# Patient Record
Sex: Male | Born: 1990 | Race: White | Hispanic: No | Marital: Single | Smoking: Never smoker
Health system: Southern US, Community
[De-identification: ages and names within clinical notes are randomized; demographics above are authoritative.]

## PROBLEM LIST (undated history)

## (undated) DIAGNOSIS — R569 Unspecified convulsions: Secondary | ICD-10-CM

## (undated) DIAGNOSIS — IMO0002 Reserved for concepts with insufficient information to code with codable children: Secondary | ICD-10-CM

## (undated) DIAGNOSIS — S060X9A Concussion with loss of consciousness of unspecified duration, initial encounter: Secondary | ICD-10-CM

## (undated) DIAGNOSIS — G43909 Migraine, unspecified, not intractable, without status migrainosus: Secondary | ICD-10-CM

## (undated) HISTORY — DX: Concussion with loss of consciousness of unspecified duration, initial encounter: S06.0X9A

## (undated) HISTORY — DX: Reserved for concepts with insufficient information to code with codable children: IMO0002

## (undated) HISTORY — DX: Migraine, unspecified, not intractable, without status migrainosus: G43.909

## (undated) HISTORY — PX: NO PAST SURGERIES: SHX2092

---
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
## 2016-04-17 DIAGNOSIS — IMO0002 Reserved for concepts with insufficient information to code with codable children: Secondary | ICD-10-CM

## 2016-04-17 HISTORY — DX: Reserved for concepts with insufficient information to code with codable children: IMO0002

## 2016-04-21 ENCOUNTER — Encounter: Payer: Self-pay | Admitting: *Deleted

## 2016-04-22 ENCOUNTER — Ambulatory Visit (INDEPENDENT_AMBULATORY_CARE_PROVIDER_SITE_OTHER): Payer: BLUE CROSS/BLUE SHIELD | Admitting: Diagnostic Neuroimaging

## 2016-04-22 ENCOUNTER — Encounter: Payer: Self-pay | Admitting: Diagnostic Neuroimaging

## 2016-04-22 VITALS — BP 135/80 | HR 57 | Ht 74.0 in | Wt 260.0 lb

## 2016-04-22 DIAGNOSIS — R55 Syncope and collapse: Secondary | ICD-10-CM | POA: Diagnosis not present

## 2016-04-22 DIAGNOSIS — R4020 Unspecified coma: Secondary | ICD-10-CM | POA: Diagnosis not present

## 2016-04-22 MED ORDER — TOPIRAMATE 50 MG PO TABS: 50.0000 mg | ORAL_TABLET | Freq: Two times a day (BID) | ORAL | 12 refills | Status: DC

## 2016-04-22 NOTE — Patient Instructions (Signed)
Thank you for coming to see [place] at [facility]. I hope we have been able to provide you high quality care today.  You may receive a patient satisfaction survey over the next few weeks. We would appreciate your feedback and comments so that we may continue to improve ourselves and the health of our patients.  - seizure work up (MRI brain and EEG)  - start topiramate (to help with migraine prevention and possible seizures); start '50mg'$  twice a day x 2 weeks, then increase to '100mg'$  twice a day  - establish with primary care to work up medical/cardiac causes of syncope  - According to [facility] law, you can not drive unless you are seizure free for at least 6 months and under physician's care.   - Please maintain seizure precautions. Do not participate in activities where a loss of awareness could harm you or someone else. No swimming alone, no tub bathing, no hot tubs, no driving, no operating motorized vehicles (cars, ATVs, motocycles, etc), lawnmowers or power tools. No standing at heights, such as rooftops, ladders or stairs. Avoid hot objects such as stoves, heaters, open fires. Wear a helmet when riding a bicycle, scooter, skateboard, etc. and avoid areas of traffic. Set your water heater to 120 degrees or less.    ~~~~~~~~~~~~~~~~~~~~~~~~~~~~~~~~~~~~~~~~~~~~~~~~~~~~~~~~~~~~~~~~~  DR. Maleia Weems'S GUIDE TO HAPPY AND HEALTHY LIVING These are some of my general health and wellness recommendations. Some of them may apply to you better than others. Please use common sense as you try these suggestions and feel free to ask me any questions.   ACTIVITY/FITNESS Mental, social, emotional and physical stimulation are very important for brain and body health. Try learning a new activity (arts, music, language, sports, games).  Keep moving your body to the best of your abilities. You can do this at home, inside or outside, the park, community center, gym or anywhere you like. Consider a  physical therapist or personal trainer to get started. Consider the app Sworkit. Fitness trackers such as smart-watches, smart-phones or Fitbits can help as well.   NUTRITION Eat more plants: colorful vegetables, nuts, seeds and berries.  Eat less sugar, salt, preservatives and processed foods.  Avoid toxins such as cigarettes and alcohol.  Drink water when you are thirsty. Warm water with a slice of lemon is an excellent morning drink to start the day.  Consider these websites for more information The Nutrition Source (https://www.henry-hernandez.biz/) Precision Nutrition (WindowBlog.ch)   RELAXATION Consider practicing mindfulness meditation or other relaxation techniques such as deep breathing, prayer, yoga, tai chi, massage. See website mindful.org or the apps Headspace or Calm to help get started.   SLEEP Try to get at least 7-8+ hours sleep per day. Regular exercise and reduced caffeine will help you sleep better. Practice good sleep hygeine techniques. See website sleep.org for more information.   PLANNING Prepare estate planning, living will, healthcare POA documents. Sometimes this is best planned with the help of an attorney. Theconversationproject.org and agingwithdignity.org are excellent resources.

## 2016-04-22 NOTE — Progress Notes (Signed)
[facility]  PATIENT: Brent Austin DOB: 1990/07/09  REFERRING CLINICIAN: Henrietta Hoover HISTORY FROM: patient  REASON FOR VISIT: new consult    HISTORICAL  CHIEF COMPLAINT:  Chief Complaint  Patient presents with  . Blacked out    [facility], "hx concussion w/LOC, possible seizure 12/2010; blacked out 04/17/16""     HISTORY OF PRESENT ILLNESS:   25 year old right-handed male here for evaluation of blackout spell.  July 2012 patient had a blackout episode which was possibly provoked by being overheated and dehydrated. Apparently he had some shaking seizure-like activity. He is evaluated by neurologist who performed MRI and EEG which were unremarkable. Patient was diagnosed with possible new onset syncope versus seizure and monitored.  Patient did well until 04/17/16 when he was at home, getting out of the shower and then felt a headache. He took 2 ibuprofen and then sat down for dinner. Apparently he was moving his food around but does not recall doing this. His girlfriend went to the other room and when she came back found him passed out on the floor. He had some blood coming from his mouth. No definite convulsions. When he woke up he was crying and in a panic. Girlfriend called EMS and while waiting for them to arrive patient blacked out a second time. Patient's girlfriend performed CPR for 2 or 3 chest compressions and then patient woke back up. He then regained memory paramedics arrived. He is feeling better and opted not to go to the hospital at that time. He went to bed and the next day woke up feeling dizzy. Therefore he went to the emergency room for evaluation. CT scan and lab testing were performed the patient was discharged after 3-4 hours.  Since that time patient has felt fine. No other symptoms. No family history of seizure. Patient has history of concussion while playing football when he was younger.  Patient has history of  headaches and migraines for some years. He describes 2 kinds of headaches. First type is in every other day mild or headache which is not that bothersome. He also has a severe left-sided headache which happens every 2-3 months associated with photophobia, phonophobia and nausea.  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   REVIEW OF SYSTEMS: Full 14 system review of systems performed and negative with exception of: Blurred vision confusion headache slurred speech dizziness passing out depression not asleep racing thoughts.  ALLERGIES: No Known Allergies  HOME MEDICATIONS: No outpatient prescriptions prior to visit.   No facility-administered medications prior to visit.     PAST MEDICAL HISTORY: Past Medical History:  Diagnosis Date  . Concussion 04/2009   football, LOC  . Migraine   . Passed out 04/17/2016   blacked out 12/2010, no diagnosis found    PAST SURGICAL HISTORY: No past surgical history on file.  FAMILY HISTORY: No family history on file.  SOCIAL HISTORY:  Social History   Social History  . Marital status: Single    Spouse name: N/A  . Number of children: 1  . Years of education: 8   Occupational History  .      [employer]- [employer]   Social History Main Topics  . Smoking status: Never Smoker  . Smokeless tobacco: Never Used  . Alcohol use Yes     Comment: 04/22/16 once a week  .  Drug use: No  . Sexual activity: Not on file   Other Topics Concern  . Not on file   Social History Narrative   Lives with girl friend   Caffeine- none     PHYSICAL EXAM  GENERAL EXAM/CONSTITUTIONAL: Vitals:  Vitals:   04/22/16 0933  BP: 135/80  Pulse: (!) 57  Weight: 260 lb (117.9 kg)  Height: 6\' 2"  (1.88 m)     Body mass index is 33.38 kg/m.  Visual Acuity Screening   Right eye Left eye Both  eyes  Without correction: 20/30 20/40   With correction:        Patient is in no distress; well developed, nourished and groomed; neck is supple  CARDIOVASCULAR:  Examination of carotid arteries is normal; no carotid bruits  Regular rate and rhythm, no murmurs  Examination of peripheral vascular system by observation and palpation is normal  EYES:  Ophthalmoscopic exam of optic discs and posterior segments is normal; no papilledema or hemorrhages  MUSCULOSKELETAL:  Gait, strength, tone, movements noted in Neurologic exam below  NEUROLOGIC: MENTAL STATUS:  No flowsheet data found.  awake, alert, oriented to person, place and time  recent and remote memory intact  normal attention and concentration  language fluent, comprehension intact, naming intact,   fund of knowledge appropriate  CRANIAL NERVE:   2nd - no papilledema on fundoscopic exam  2nd, 3rd, 4th, 6th - pupils equal and reactive to light, visual fields full to confrontation, extraocular muscles intact, no nystagmus  5th - facial sensation symmetric  7th - facial strength symmetric  8th - hearing intact  9th - palate elevates symmetrically, uvula midline  11th - shoulder shrug symmetric  12th - tongue protrusion midline  MOTOR:   normal bulk and tone, full strength in the BUE, BLE  SENSORY:   normal and symmetric to light touch, temperature, vibration  COORDINATION:   finger-nose-finger, fine finger movements normal  REFLEXES:   deep tendon reflexes present and symmetric  GAIT/STATION:   narrow based gait; romberg is negative    DIAGNOSTIC DATA (LABS, IMAGING, TESTING) - I reviewed patient records, labs, notes, testing and imaging myself where available.  No results found for: WBC, HGB, HCT, MCV, PLT No results found for: NA, K, CL, CO2, GLUCOSE, BUN, CREATININE, CALCIUM, PROT, ALBUMIN, AST, ALT, ALKPHOS, BILITOT, GFRNONAA, GFRAA No results found for: CHOL, HDL, LDLCALC,  LDLDIRECT, TRIG, CHOLHDL No results found for: QQVZ5GHGBA1C No results found for: VITAMINB12 No results found for: TSH   04/18/16 CT head [I reviewed images myself and agree with interpretation. -VRP]  - negative      ASSESSMENT AND PLAN  25 y.o. year old male here with 2 episodes of syncope, with features suspicious for seizure. Will complete workup with MRI brain the EEG. Also will try empiric topiramate to help with migraine prevention as well as possible seizure disorder.   Ddx: seizure vs syncope  1. Loss of consciousness (HCC)   2. Syncope and collapse      PLAN:  - seizure work up (MRI brain w/wo and EEG)  - start topiramate (to help with migraine prevention and possible seizures); start 50mg  twice a day x 2 weeks, then increase to 100mg  twice a day; drink plenty of water (remote kidney stone age 25 years old)  - establish with primary care to work up medical/cardiac causes of syncope  - According to [employer] law, you can not drive unless you are seizure free for at least 6 months and under  physician's care.   - Please maintain seizure precautions. Do not participate in activities where a loss of awareness could harm you or someone else. No swimming alone, no tub bathing, no hot tubs, no driving, no operating motorized vehicles (cars, ATVs, motocycles, etc), lawnmowers or power tools. No standing at heights, such as rooftops, ladders or stairs. Avoid hot objects such as stoves, heaters, open fires. Wear a helmet when riding a bicycle, scooter, skateboard, etc. and avoid areas of traffic. Set your water heater to 120 degrees or less.  Orders Placed This Encounter  Procedures  . MR BRAIN W WO CONTRAST  . EEG adult   Meds ordered this encounter  Medications  . topiramate (TOPAMAX) 50 MG tablet    Sig: Take 1-2 tablets (50-100 mg total) by mouth 2 (two) times daily.    Dispense:  120 tablet    Refill:  12   Return in about 3 months (around 07/23/2016).    Suanne MarkerVIKRAM R.  Alania Overholt, MD 04/22/2016, 10:29 AM Certified in Neurology, Neurophysiology and Neuroimaging  [facility] [address], [address] [place], [place] [id number] [phone]

## 2016-04-25 ENCOUNTER — Ambulatory Visit (INDEPENDENT_AMBULATORY_CARE_PROVIDER_SITE_OTHER): Payer: BLUE CROSS/BLUE SHIELD

## 2016-04-25 DIAGNOSIS — R4020 Unspecified coma: Secondary | ICD-10-CM

## 2016-04-25 DIAGNOSIS — R55 Syncope and collapse: Secondary | ICD-10-CM

## 2016-04-25 NOTE — Procedures (Signed)
    History:  Brent ArthursJustin Mowers is a 25 year old patient with a history of a blackout episode in July 2012 promoted by being overheated and dehydrated. On 04/17/2016, the patient was getting out of the shower and felt a headache, the patient was found later passed out on the floor. No convulsions were witnessed. The patient is being evaluated for possible seizures.  This is a routine EEG. No skull defects are noted. Medications include Topamax.    EEG classification: Normal awake and asleep  Description of the recording: The background rhythms of this recording consists of a fairly well modulated medium amplitude background activity of 9 Hz. As the record progresses, the patient initially is in the waking state, but appears to enter the early stage II sleep during the recording, with rudimentary sleep spindles and vertex sharp wave activity seen. During the wakeful state, photic stimulation is performed, and this results in a bilateral and symmetric photic driving response. Hyperventilation was also performed, and this results in a minimal buildup of the background rhythm activities without significant slowing seen. At no time during the recording does there appear to be evidence of spike or spike wave discharges or evidence of focal slowing. EKG monitor shows no evidence of cardiac rhythm abnormalities with a heart rate of 72.  Impression: This is a normal EEG recording in the waking and sleeping state. No evidence of ictal or interictal discharges were seen at any time during the recording.

## 2016-05-02 ENCOUNTER — Telehealth: Payer: Self-pay | Admitting: *Deleted

## 2016-05-02 NOTE — Telephone Encounter (Signed)
Agree with plan. --VRP 

## 2016-05-02 NOTE — Telephone Encounter (Signed)
Per Dr Marjory LiesPenumalli, spoke with patient and informed him his EEG result was normal. Reviewed Dr Richrd HumblesPenumalli's plan, advice and instrucitons with patient. Patient then stated he began taking Topiramate last Sunday, took twice daily but had a "bad migraine Tues". He stated his migraines "always begin with my nose bleeding then I see a circle with my eyes that gets bigger."  He took Topiramate twice Tues, migraine reoccurred Wed, but he did not take Topiramate. He has not taken it since and stated "I have been fine." He stated that he had never had migraines back to back; the closest together he's had migraines is 6 weeks apart. This RN advised he not take Topiramate x 1 week then begin again. If migraines return, advised he stop all together and notify this RN. Confirmed his MRI for 05/11/16. Also inquired if he has established PCP. Patient stated he has not; advised he check with his insurance for providers in his network, and reminded him Dr Marjory LiesPenumalli wants PCP to check for possible medical/cardiac causes of his syncope.  This RN advised Dr Marjory LiesPenumalli will be notified of this conversation, and pt will be called back if he has further information, instructions.  Patient verbalized understanding, agreement to above conversation.

## 2016-05-09 ENCOUNTER — Emergency Department (HOSPITAL_COMMUNITY): Payer: BLUE CROSS/BLUE SHIELD

## 2016-05-09 ENCOUNTER — Other Ambulatory Visit (HOSPITAL_COMMUNITY): Payer: BLUE CROSS/BLUE SHIELD

## 2016-05-09 ENCOUNTER — Observation Stay (HOSPITAL_COMMUNITY)
Admission: EM | Admit: 2016-05-09 | Discharge: 2016-05-10 | Disposition: A | Discharge status: Home / Self Care | Payer: BLUE CROSS/BLUE SHIELD | Attending: Family Medicine | Admitting: Family Medicine

## 2016-05-09 ENCOUNTER — Encounter (HOSPITAL_COMMUNITY): Payer: Self-pay | Admitting: Emergency Medicine

## 2016-05-09 DIAGNOSIS — G40909 Epilepsy, unspecified, not intractable, without status epilepticus: Secondary | ICD-10-CM | POA: Diagnosis not present

## 2016-05-09 DIAGNOSIS — R55 Syncope and collapse: Secondary | ICD-10-CM | POA: Diagnosis present

## 2016-05-09 DIAGNOSIS — G43909 Migraine, unspecified, not intractable, without status migrainosus: Secondary | ICD-10-CM | POA: Diagnosis not present

## 2016-05-09 HISTORY — DX: Unspecified convulsions: R56.9

## 2016-05-09 LAB — RAPID URINE DRUG SCREEN, HOSP PERFORMED
AMPHETAMINES: NOT DETECTED
Barbiturates: NOT DETECTED
Benzodiazepines: NOT DETECTED
COCAINE: NOT DETECTED
OPIATES: NOT DETECTED
Tetrahydrocannabinol: NOT DETECTED

## 2016-05-09 LAB — COMPREHENSIVE METABOLIC PANEL
ALT: 55 U/L (ref 17–63)
ANION GAP: 9 (ref 5–15)
AST: 44 U/L — ABNORMAL HIGH (ref 15–41)
Albumin: 4.3 g/dL (ref 3.5–5.0)
Alkaline Phosphatase: 64 U/L (ref 38–126)
BILIRUBIN TOTAL: 0.9 mg/dL (ref 0.3–1.2)
BUN: 10 mg/dL (ref 6–20)
CO2: 24 mmol/L (ref 22–32)
Calcium: 9.5 mg/dL (ref 8.9–10.3)
Chloride: 107 mmol/L (ref 101–111)
Creatinine, Ser: 1.07 mg/dL (ref 0.61–1.24)
Glucose, Bld: 93 mg/dL (ref 65–99)
POTASSIUM: 4.2 mmol/L (ref 3.5–5.1)
Sodium: 140 mmol/L (ref 135–145)
TOTAL PROTEIN: 6.9 g/dL (ref 6.5–8.1)

## 2016-05-09 LAB — CBC WITH DIFFERENTIAL/PLATELET
Basophils Absolute: 0 10*3/uL (ref 0.0–0.1)
Basophils Relative: 0 %
EOS PCT: 2 %
Eosinophils Absolute: 0.1 10*3/uL (ref 0.0–0.7)
HEMATOCRIT: 44.3 % (ref 39.0–52.0)
Hemoglobin: 15.5 g/dL (ref 13.0–17.0)
LYMPHS PCT: 41 %
Lymphs Abs: 2.7 10*3/uL (ref 0.7–4.0)
MCH: 31 pg (ref 26.0–34.0)
MCHC: 35 g/dL (ref 30.0–36.0)
MCV: 88.6 fL (ref 78.0–100.0)
MONO ABS: 0.4 10*3/uL (ref 0.1–1.0)
MONOS PCT: 6 %
NEUTROS ABS: 3.4 10*3/uL (ref 1.7–7.7)
Neutrophils Relative %: 51 %
PLATELETS: 218 10*3/uL (ref 150–400)
RBC: 5 MIL/uL (ref 4.22–5.81)
RDW: 12.8 % (ref 11.5–15.5)
WBC: 6.7 10*3/uL (ref 4.0–10.5)

## 2016-05-09 LAB — URINALYSIS, ROUTINE W REFLEX MICROSCOPIC
Bilirubin Urine: NEGATIVE
Glucose, UA: NEGATIVE mg/dL
HGB URINE DIPSTICK: NEGATIVE
Ketones, ur: NEGATIVE mg/dL
LEUKOCYTES UA: NEGATIVE
NITRITE: NEGATIVE
PROTEIN: NEGATIVE mg/dL
Specific Gravity, Urine: 1.023 (ref 1.005–1.030)
pH: 5.5 (ref 5.0–8.0)

## 2016-05-09 LAB — I-STAT TROPONIN, ED: Troponin i, poc: 0 ng/mL (ref 0.00–0.08)

## 2016-05-09 LAB — ETHANOL: Alcohol, Ethyl (B): <5 mg/dL (ref <5)

## 2016-05-09 MED ORDER — ACETAMINOPHEN 650 MG RE SUPP: 650.0000 mg | Freq: Four times a day (QID) | RECTAL | Status: DC | PRN

## 2016-05-09 MED ORDER — GADOBENATE DIMEGLUMINE 529 MG/ML IV SOLN
20.0000 mL | Freq: Once | INTRAVENOUS | Status: AC | PRN
  Administered 2016-05-09: 20 mL via INTRAVENOUS

## 2016-05-09 MED ORDER — DIPHENHYDRAMINE HCL 50 MG/ML IJ SOLN
25.0000 mg | Freq: Once | INTRAMUSCULAR | Status: AC
  Administered 2016-05-09: 25 mg via INTRAVENOUS
  Filled 2016-05-09: qty 1

## 2016-05-09 MED ORDER — SODIUM CHLORIDE 0.9% FLUSH
3.0000 mL | Freq: Two times a day (BID) | INTRAVENOUS | Status: DC
  Administered 2016-05-09 – 2016-05-10 (×3): 3 mL via INTRAVENOUS

## 2016-05-09 MED ORDER — TOPIRAMATE 25 MG PO TABS
50.0000 mg | ORAL_TABLET | Freq: Two times a day (BID) | ORAL | Status: DC
  Administered 2016-05-09 – 2016-05-10 (×2): 50 mg via ORAL
  Filled 2016-05-09 (×2): qty 2

## 2016-05-09 MED ORDER — ACETAMINOPHEN 325 MG PO TABS: 650.0000 mg | ORAL_TABLET | Freq: Four times a day (QID) | ORAL | Status: DC | PRN

## 2016-05-09 MED ORDER — ENOXAPARIN SODIUM 30 MG/0.3ML ~~LOC~~ SOLN: 30.0000 mg | SUBCUTANEOUS | Status: DC

## 2016-05-09 MED ORDER — DEXAMETHASONE SODIUM PHOSPHATE 10 MG/ML IJ SOLN
10.0000 mg | Freq: Once | INTRAMUSCULAR | Status: AC
  Administered 2016-05-09: 10 mg via INTRAVENOUS
  Filled 2016-05-09: qty 1

## 2016-05-09 MED ORDER — METOCLOPRAMIDE HCL 5 MG/ML IJ SOLN
10.0000 mg | Freq: Once | INTRAMUSCULAR | Status: AC
  Administered 2016-05-09: 10 mg via INTRAVENOUS
  Filled 2016-05-09: qty 2

## 2016-05-09 NOTE — ED Notes (Signed)
Pt returned to room from imaging department.  

## 2016-05-09 NOTE — H&P (Signed)
[facility] History and Physical Service Pager: [phone]  Patient name: Brent CulverJustin J Austin Medical record number: [id number] Date of birth: [birth year] Age: 25 y.o. Gender: male  Primary Care Provider: PROVIDER NOT IN SYSTEM Consultants: Cardiology, Neurology Code Status: FULL  Chief Complaint: Syncopal Episode  Assessment and Plan: Brent Austin is a 25 y.o. male presenting with syncopal event. PMH is significant for migraines, syncopal event in 2012, hx of concussion.  Syncopal event- second event in 2 weeks, has seen neurology outpatient with normal EEG 04/25/16. Was scheduled for MRI on 12/6. Recently started on Topamax for migraines, feels like this made things worse. Concern due to no prodrome prior to episodes, patient cannot remember events leading up to syncope, appears to not be breathing and has had CPR after both events by fiance and coworkers. No seizure like activity or loss of bladder/bowels. No post-ictal state. Unknown amount of time without consciousness. Differential includes cardiac etiology such as HCOM, arrythmia, vs vasovagal event. Unlikely to be seizure or dehydration. CT head negative, MRI head negative. Vital signs stable in ED, no signs of infectious etiology. UDS negative, Ethanol level <5. EKG normal sinus rhythm with left axis deviation however artifact noted, troponin negative at 0.0 - Admit to telemetry for observation, attending Dr. Randolm IdolFletke - Continuous cardiac monitoring - Vitals per unit - Seizure precautions - Will consult cardiology, appreciate recommendations - Consult to neurology while patient is in hospital  - Repeat EKG -  Repeat EEG - Echocardiogram - AM CBC, BMP - Orthostatic vitals - Will obtain echocardiogram  Migraines- recently started on Topamax, felt like this brought on migraine, stopped this medication for 1 week. Started taking it again yesterday, then had syncopal event today. -Continue  topamax -Will give one time dose of benadryl, decadron, reglan for headache -Tylenol as needed for pain  FEN/GI: regular diet Prophylaxis: lovenox  Disposition: pending cardiology and neurology evaluation  History of Present Illness:  Brent Austin is a 25 y.o. male presenting with syncopal event.   Was at work this morning and had a bad headache. 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 Coworkers performed CPR on him because he was blue and appeared to not be breathing. He awoke to EMS there. Was able to answer questions appropriately. Was brought here. Feels well in ED except for continued strong headache. Typically goes to work at 6-6:30 am, occasionally eats breakfast. Did not eat breakfast this morning, reports he has not eaten anything in almost 24 hours. Denies any feelings of lightheadedness prior to fall. Reports feeling slightly nauseated this morning in addition to headache.  This is second syncopal episode in 2 weeks and his third episode overall. First episode was in 2012 at grandparent's house. Attributed at that time to being dehydrated and overheated. Concern for seizure at that time. Had been without an episode until 2 weeks ago. Had a bad headache and fiance found him on the floor. She reports he had a headache, he took ibuprofen, was walking around and she heard a thud from the other room. She was able to wake him up and he was confused, upset, saying he could not feel his body at all and was trying to make himself vomit. He then lost consciousness again and was not breathing. Fiance performed CPR on him for "seconds" and he came to, gasping for breath. EMS arrived. He did not remember walking around  after taking ibuprofen nor the interval between syncopal events.   Patient reports a history of headaches and migraines. History of concussion in high school playing football. Then went into [employer]  and was diagnosed with migraines. 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 Typically gets a migraine once every 6 months. He will have a nosebleed prior and feel a migraine coming on with photophobia and visual changes. Typically nothing gets rid of migraine except for sleeping. Has had two migraines in last two weeks which is unusual for him. These occurred after syncopal event 2 weeks ago. He saw neurology and was started on Topamax. Took this for first time before having migraines. Was able to get rid of second migraine by taking Excedrin. He stopped taking Topamax thinking it was causing migraines and at recommendation of neurology office. Restarted Topamax yesterday.  His headaches occur more frequently and typically resolve if he is distracted at work. They are worse with stress. Usually left sided, frontal. No associated tearing or nasal discharge.    Review Of Systems: Per HPI with the following additions:  Review of Systems  Constitutional: Negative for chills, diaphoresis, fever and malaise/fatigue.  HENT: Positive for nosebleeds.   Eyes: Negative for blurred vision and double vision.  Respiratory: Negative for cough and shortness of breath.   Cardiovascular: Negative for chest pain and palpitations.  Gastrointestinal: Negative for abdominal pain, constipation, diarrhea, nausea and vomiting.  Genitourinary: Negative for dysuria.  Neurological: Positive for loss of consciousness and headaches. Negative for tingling, tremors, sensory change, speech change, focal weakness and weakness.  Psychiatric/Behavioral: Negative for substance abuse.    Patient Active Problem List   Diagnosis Date Noted  . Syncope and collapse 05/09/2016    Past Medical History: Past Medical History:  Diagnosis Date  . Concussion 04/2009   football, LOC  . Migraine   . Passed out 04/17/2016   blacked out 12/2010, no diagnosis found  . Seizures (HCC)     Past Surgical History: History  reviewed. No pertinent surgical history.  Social History: Social History  Substance Use Topics  . Smoking status: Never Smoker  . Smokeless tobacco: Never Used  . Alcohol use Yes     Comment: 04/22/16 once a week   Additional social history: works at [employer] in [employer], lives with Fiance Please also refer to relevant sections of EMR.  Family History: No family history on file. Mother- migraine history Strong family history of breast cancer   No family history of seizures, sudden death  Allergies and Medications: No Known Allergies No current facility-administered medications on file prior to encounter.    Current Outpatient Prescriptions on File Prior to Encounter  Medication Sig Dispense Refill  . topiramate (TOPAMAX) 50 MG tablet Take 1-2 tablets (50-100 mg total) by mouth 2 (two) times daily. (Patient taking differently: Take 50 mg by mouth 2 (two) times daily. ) 120 tablet 12    Objective: BP 124/74   Pulse 73   Resp 19   SpO2 99%  Exam: General: well developed, well nourished, in no acute distress Eyes: EOMI, PERRLA, nystagmus on vertical gaze to left ENTM: moist mucous membranes, good dentition, no erythema or discharge noted in posterior oropharynx Neck: supple, non-tender, no lymphadenopathy Cardiovascular: bradycardic with regular rhythm, no murmurs rubs or gallops Respiratory: lungs clear to auscultation bilaterally, no increased work of breathing Gastrointestinal: +BS, soft, non-tender, non-distended MSK: moves all extremities equally Derm: skin warm, dry, no rashes noted on back or arms  Neuro: AOx3, CN2-12 in tact, 5/5 strength in upper and lower extremities bilaterally, sensation in tact Psych: normal mood and affect  Labs and Imaging: CBC BMET   Recent Labs Lab 05/09/16 0940  WBC 6.7  HGB 15.5  HCT 44.3  PLT 218    Recent Labs Lab 05/09/16 0940  NA 140  K 4.2  CL 107  CO2 24  BUN 10  CREATININE 1.07  GLUCOSE 93   CALCIUM 9.5    - Troponin 0 - Urinalysis negative - Alcohol negative - UDS negative  Ct Head Wo Contrast  Result Date: 05/09/2016 CLINICAL DATA:  Patient found down at work today. EXAM: CT HEAD WITHOUT CONTRAST TECHNIQUE: Contiguous axial images were obtained from the base of the skull through the vertex without intravenous contrast. COMPARISON:  04/08/2016 FINDINGS: Brain: There is no evidence for acute hemorrhage, hydrocephalus, mass lesion, or abnormal extra-axial fluid collection. No definite CT evidence for acute infarction. Vascular: No hyperdense vessel or unexpected calcification. Skull: No evidence for fracture. No worrisome lytic or sclerotic lesion. Sinuses/Orbits: The visualized paranasal sinuses and mastoid air cells are clear. Visualized portions of the globes and intraorbital fat are unremarkable. Other: None. IMPRESSION: Normal CT scan of the brain. Electronically Signed   By: Kennith CenterEric  Mansell M.D.   On: 05/09/2016 10:47   Mr Laqueta JeanBrain W And Wo Contrast  Result Date: 05/09/2016 CLINICAL DATA:  Syncope. Found down and unresponsive. History of seizure disorder. EXAM: MRI HEAD WITHOUT AND WITH CONTRAST TECHNIQUE: Multiplanar, multiecho pulse sequences of the brain and surrounding structures were obtained without and with intravenous contrast. CONTRAST:  20mL MULTIHANCE GADOBENATE DIMEGLUMINE 529 MG/ML IV SOLN COMPARISON:  Head CT from earlier today FINDINGS: Brain: No acute or remote infarction, hemorrhage, hydrocephalus, extra-axial collection or mass lesion. No abnormal enhancement. Symmetric normal hippocampal formations. No cortical finding to explain seizure. Vascular: Normal flow voids Skull and upper cervical spine: Negative Sinuses/Orbits: Negative IMPRESSION: Normal brain MRI. Electronically Signed   By: Marnee SpringJonathon  Watts M.D.   On: 05/09/2016 14:15   Tillman SersAngela C Riccio, DO 05/09/2016, 2:36 PM PGY-1, [facility] [facility] FPTS Intern pager: [phone], text pages welcome  Upper  Level Addendum:  I have seen and evaluated this patient along with Dr. Wonda Oldsiccio and reviewed the above note, making necessary revisions in blue.   Dr. Garry Heateraleigh Saraih Lorton, DO, PGY 05/09/2016; 6:20 PM

## 2016-05-09 NOTE — Progress Notes (Signed)
Patient arrived to 3E30 from ED via wheelchair.  Patient ambulated to bed with no assistance needed.  Pt. Is alert and oriented and VSS.  Patient oriented to room including call bell, phone, and how to reach RN/NT.  Will continue to monitor patient.

## 2016-05-09 NOTE — Consult Note (Addendum)
CARDIOLOGY CONSULT NOTE       Patient ID: Brent Austin MRN: [id number] DOB/AGE: 31-Mar-1991 25 y.o.  Admit date: 05/09/2016 Referring Physician: Randolm IdolFletke Primary Physician: PROVIDER NOT IN SYSTEM Primary Cardiologist: New/Nishan Reason for Consultation: Syncope  Active Problems:   Syncope and collapse   HPI:  25 y.o. no previous heart issues admitted with recurrent syncope Had syncope associated with headaches and migraine in 2012 Indicates Negative w/u in [place]. Seen there 2 weeks ago with syncopal episode after headache again. No antecedent chest pain palpitations or dyspnea Headache not full blown migraine. Had some nausea. "Next thing I new I was waking up"  Had some concussions growing up playing pick up football And one episode of head trauma when younger hit in head with rock. Younger brother and parents fine with no cardiac issues. No history of pacer, WPW, Congenital heart disease or long QT in family Lethargic now Denies drugs ETOH not diabetic. 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 Concussion 04/2009   football, LOC  . Migraine   . Passed out 04/17/2016   blacked out 12/2010, no diagnosis found  . Seizures (HCC)     History reviewed. No pertinent family history.  Social History   Social History  . Marital status: Single    Spouse name: N/A  . Number of children: 1  . Years of education: 6012   Occupational History  .      [employer]- [employer]   Social History Main Topics  . Smoking status: Never Smoker  . Smokeless tobacco: Never Used  . Alcohol use Yes     Comment: 04/22/16 once a week  . Drug use: No  . Sexual activity: Not on file   Other Topics Concern  . Not on file   Social History Narrative   Lives with girl friend   Caffeine- none    Past Surgical History:  Procedure Laterality Date  . NO PAST SURGERIES       . enoxaparin (LOVENOX) injection  30 mg Subcutaneous Q24H  . sodium chloride flush  3 mL Intravenous Q12H  . topiramate  50 mg Oral BID     Physical Exam: Blood pressure 132/74, pulse (!) 46, temperature 98.3 F (36.8 C), temperature source Oral, resp. rate 18, height 6\' 2"  (1.88 m), weight 120.4 kg (265 lb 8 oz), SpO2 96 %.    Affect appropriate Healthy:  appears stated age HEENT: normal Neck supple with no adenopathy JVP normal no bruits no thyromegaly Lungs clear with no wheezing and good diaphragmatic motion Heart:  S1/S2 no murmur, no rub, gallop or click PMI normal Abdomen: benighn, BS positve, no tenderness, no AAA no bruit.  No HSM or HJR Distal pulses intact with no bruits No edema Neuro non-focal Skin warm and dry tatoo right shoulder and forearm  No muscular weakness   Labs:   Lab Results  Component Value Date   WBC 6.7 05/09/2016   HGB 15.5 05/09/2016   HCT 44.3 05/09/2016   MCV 88.6 05/09/2016   PLT 218 05/09/2016    Recent Labs Lab 05/09/16 0940  NA 140  K 4.2  CL 107  CO2 24  BUN 10  CREATININE 1.07  CALCIUM 9.5  PROT 6.9  BILITOT 0.9  ALKPHOS 64  ALT 55  AST 44*  GLUCOSE 93      Radiology: Ct Head Wo Contrast  Result Date: 05/09/2016 CLINICAL DATA:  Patient found down at work today. EXAM: CT HEAD WITHOUT CONTRAST TECHNIQUE: Contiguous axial images were obtained from the base of the skull through the vertex without intravenous contrast. COMPARISON:  04/08/2016 FINDINGS: Brain: There is no evidence for acute hemorrhage, hydrocephalus, mass lesion, or abnormal extra-axial fluid collection. No definite CT evidence for acute infarction. Vascular: No hyperdense vessel or unexpected calcification. Skull: No evidence for fracture. No worrisome lytic or sclerotic lesion. Sinuses/Orbits: The visualized paranasal sinuses and mastoid air cells are  clear. Visualized portions of the globes and intraorbital fat are unremarkable. Other: None. IMPRESSION: Normal CT scan of the brain. Electronically Signed   By: Kennith CenterEric  Mansell M.D.   On: 05/09/2016 10:47   Mr Laqueta JeanBrain W And Wo Contrast  Result Date: 05/09/2016 CLINICAL DATA:  Syncope. Found down and unresponsive. History of seizure disorder. EXAM: MRI HEAD WITHOUT AND WITH CONTRAST TECHNIQUE: Multiplanar, multiecho pulse sequences of the brain and surrounding structures were obtained without and with intravenous contrast. CONTRAST:  20mL MULTIHANCE GADOBENATE DIMEGLUMINE 529 MG/ML IV SOLN COMPARISON:  Head CT from earlier today FINDINGS: Brain: No acute or remote infarction, hemorrhage, hydrocephalus, extra-axial collection or mass lesion. No abnormal enhancement. Symmetric normal hippocampal formations. No cortical finding to explain seizure. Vascular: Normal flow voids Skull and upper cervical spine: Negative Sinuses/Orbits: Negative IMPRESSION: Normal brain MRI. Electronically Signed   By: Marnee SpringJonathon  Watts M.D.   On: 05/09/2016 14:15    EKG: NSR normal ECG normal QT and normal QRS Telemetry :  NSR no arrhythmia   ASSESSMENT AND PLAN:  Syncope: no red flags regarding cardiac etiology No cardiac symptoms before or during event. Normal exam , Normal ECG and  No high risk family history. Will order echo and ETT to r/o structural heart disease, stress induced arrhythmia and show normal Hemodynamic response to exercise.  CT/MRI normal Inpatient neurology consult also ordered. EEG was normal on 04/25/16  Signed: Charlton Hawseter Nishan 05/09/2016, 5:36 PM

## 2016-05-09 NOTE — ED Notes (Signed)
Patient transported to CT 

## 2016-05-09 NOTE — ED Provider Notes (Signed)
[facility]-EMERGENCY DEPT Provider Note   CSN: [id number] Arrival date & time: 05/09/16  0935     History   Chief Complaint Chief Complaint  Patient presents with  . Loss of Consciousness    HPI Brent Austin is a 25 y.o. male.  HPI Patient presents after unwitnessed syncopal episode that occurred at work. Patient states he had no presyncopal symptoms including lightheadedness, visual changes or nausea. Found by coworkers and noted to be cyanotic. Chest compressions initiated. Patient states he remembers when EMS arrived. He denies any chest pain or shortness of breath. States he woke up with a left frontal headache that is been persistent today. Worsened roughly 2 hours ago. No radiation of the pain. He has had some ear ringing and the sensation of room spinning. This is worse with any movement. Patient has similar episode roughly 2 weeks ago. Went to [facility] emergency department. Had a CT head which was normal. This followed up with neurology. States he's had an EEG which was normal. Past Medical History:  Diagnosis Date  . Concussion 04/2009   football, LOC  . Migraine   . Passed out 04/17/2016   blacked out 12/2010, no diagnosis found  . Seizures [facility])     Patient Active Problem List   Diagnosis Date Noted  . Syncope and collapse 05/09/2016    Past Surgical History:  Procedure Laterality Date  . NO PAST SURGERIES         Home Medications    Prior to Admission medications   Medication Sig Start Date End Date Taking? Authorizing Provider  topiramate (TOPAMAX) 50 MG tablet Take 1-2 tablets (50-100 mg total) by mouth 2 (two) times daily. Patient taking differently: Take 50 mg by mouth 2 (two) times daily.  04/22/16  Yes Suanne Marker, MD    Family History History reviewed. No pertinent family history.  Social History Social History  Substance Use Topics  . Smoking status: Never Smoker  . Smokeless tobacco: Never Used  . Alcohol use Yes     Comment: 04/22/16  once a week     Allergies   Patient has no known allergies.   Review of Systems Review of Systems  Constitutional: Negative for chills and fever.  HENT: Positive for congestion. Negative for ear discharge, ear pain, facial swelling, sinus pain, sinus pressure, sore throat, trouble swallowing and voice change.   Eyes: Positive for visual disturbance. Negative for photophobia.  Respiratory: Negative for cough and shortness of breath.   Cardiovascular: Negative for chest pain, palpitations and leg swelling.  Gastrointestinal: Positive for nausea. Negative for abdominal pain, diarrhea and vomiting.  Genitourinary: Negative for dysuria, flank pain and frequency.  Musculoskeletal: Negative for back pain, myalgias, neck pain and neck stiffness.  Skin: Negative for rash and wound.  Neurological: Positive for dizziness, syncope and headaches. Negative for speech difficulty, weakness, light-headedness and numbness.  Psychiatric/Behavioral: Negative for confusion.  All other systems reviewed and are negative.    Physical Exam Updated Vital Signs BP 132/84 (BP Location: Left Arm)   Pulse 90   Temp 98.6 F (37 C) (Oral)   Resp 16   Ht 6\' 2"  (1.88 m)   Wt 264 lb (119.7 kg) Comment: Scale B  SpO2 97%   BMI 33.90 kg/m   Physical Exam  Constitutional: He is oriented to person, place, and time. He appears well-developed and well-nourished.  HENT:  Head: Normocephalic and atraumatic.  Mouth/Throat: Oropharynx is clear and moist. No oropharyngeal exudate.  No intraoral  trauma.  Eyes: EOM are normal. Pupils are equal, round, and reactive to light.  Horizontal nystagmus  Neck: Normal range of motion. Neck supple.  No meningismus. No posterior midline cervical tenderness to palpation.  Cardiovascular: Normal rate and regular rhythm.  Exam reveals no gallop and no friction rub.   No murmur heard. Pulmonary/Chest: Effort normal and breath sounds normal.  Abdominal: Soft. Bowel sounds are  normal. There is no tenderness. There is no rebound and no guarding.  Musculoskeletal: Normal range of motion. He exhibits no edema or tenderness.  No lower extremity swelling, asymmetry or tenderness.  Neurological: He is alert and oriented to person, place, and time.  Patient is alert and oriented x3 with clear, goal oriented speech. Patient has 5/5 motor in all extremities. Sensation is intact to light touch. Bilateral finger-to-nose is normal with no signs of dysmetria.   Skin: Skin is warm and dry. Capillary refill takes less than 2 seconds. No rash noted. No erythema.  Psychiatric: He has a normal mood and affect. His behavior is normal.  Nursing note and vitals reviewed.    ED Treatments / Results  Labs (all labs ordered are listed, but only abnormal results are displayed) Labs Reviewed  COMPREHENSIVE METABOLIC PANEL - Abnormal; Notable for the following:       Result Value   AST 44 (*)    All other components within normal limits  BASIC METABOLIC PANEL - Abnormal; Notable for the following:    Glucose, Bld 153 (*)    All other components within normal limits  CBC - Abnormal; Notable for the following:    WBC 12.9 (*)    All other components within normal limits  GLUCOSE, CAPILLARY - Abnormal; Notable for the following:    Glucose-Capillary 119 (*)    All other components within normal limits  LIPID PANEL - Abnormal; Notable for the following:    Triglycerides 163 (*)    HDL 38 (*)    LDL Cholesterol 111 (*)    All other components within normal limits  CBC WITH DIFFERENTIAL/PLATELET  URINALYSIS, ROUTINE W REFLEX MICROSCOPIC (NOT AT [facility])  RAPID URINE DRUG SCREEN, HOSP PERFORMED  ETHANOL  TSH  HEMOGLOBIN A1C  I-STAT TROPOININ, ED    EKG  EKG Interpretation  Date/Time:  Monday May 09 2016 09:39:55 EST Ventricular Rate:  63 PR Interval:    QRS Duration: 101 QT Interval:  402 QTC Calculation: 412 R Axis:   -35 Text Interpretation:  Sinus rhythm Short PR  interval Left axis deviation Confirmed by Ranae PalmsYELVERTON  MD, Salvatore Shear ([id number]) on 05/09/2016 10:25:20 AM       Radiology Ct Head Wo Contrast  Result Date: 05/09/2016 CLINICAL DATA:  Patient found down at work today. EXAM: CT HEAD WITHOUT CONTRAST TECHNIQUE: Contiguous axial images were obtained from the base of the skull through the vertex without intravenous contrast. COMPARISON:  04/08/2016 FINDINGS: Brain: There is no evidence for acute hemorrhage, hydrocephalus, mass lesion, or abnormal extra-axial fluid collection. No definite CT evidence for acute infarction. Vascular: No hyperdense vessel or unexpected calcification. Skull: No evidence for fracture. No worrisome lytic or sclerotic lesion. Sinuses/Orbits: The visualized paranasal sinuses and mastoid air cells are clear. Visualized portions of the globes and intraorbital fat are unremarkable. Other: None. IMPRESSION: Normal CT scan of the brain. Electronically Signed   By: Kennith CenterEric  Mansell M.D.   On: 05/09/2016 10:47   Mr Laqueta JeanBrain W And Wo Contrast  Result Date: 05/09/2016 CLINICAL DATA:  Syncope. Found down and  unresponsive. History of seizure disorder. EXAM: MRI HEAD WITHOUT AND WITH CONTRAST TECHNIQUE: Multiplanar, multiecho pulse sequences of the brain and surrounding structures were obtained without and with intravenous contrast. CONTRAST:  20mL MULTIHANCE GADOBENATE DIMEGLUMINE 529 MG/ML IV SOLN COMPARISON:  Head CT from earlier today FINDINGS: Brain: No acute or remote infarction, hemorrhage, hydrocephalus, extra-axial collection or mass lesion. No abnormal enhancement. Symmetric normal hippocampal formations. No cortical finding to explain seizure. Vascular: Normal flow voids Skull and upper cervical spine: Negative Sinuses/Orbits: Negative IMPRESSION: Normal brain MRI. Electronically Signed   By: Marnee SpringJonathon  Watts M.D.   On: 05/09/2016 14:15    Procedures Procedures (including critical care time)  Medications Ordered in ED Medications  topiramate  (TOPAMAX) tablet 50 mg (50 mg Oral Given 05/10/16 1141)  sodium chloride flush (NS) 0.9 % injection 3 mL (3 mLs Intravenous Given 05/10/16 1141)  acetaminophen (TYLENOL) tablet 650 mg (not administered)    Or  acetaminophen (TYLENOL) suppository 650 mg (not administered)  enoxaparin (LOVENOX) injection 60 mg (not administered)  gadobenate dimeglumine (MULTIHANCE) injection 20 mL (20 mLs Intravenous Contrast Given 05/09/16 1355)  diphenhydrAMINE (BENADRYL) injection 25 mg (25 mg Intravenous Given 05/09/16 1640)  dexamethasone (DECADRON) injection 10 mg (10 mg Intravenous Given 05/09/16 1640)  metoCLOPramide (REGLAN) injection 10 mg (10 mg Intravenous Given 05/09/16 1640)     Initial Impression / Assessment and Plan / ED Course  I have reviewed the triage vital signs and the nursing notes.  Pertinent labs & imaging results that were available during my care of the patient were reviewed by me and considered in my medical decision making (see chart for details).  Clinical Course   Patient remains hemodynamically stable in the emergency department. Discussed with family medicine teaching service and will admit for telemetry observation.   Final Clinical Impressions(s) / ED Diagnoses   Final diagnoses:  Syncope and collapse    New Prescriptions Discharge Medication List as of 05/10/2016  2:53 PM       Loren Raceravid Delmas Faucett, MD 05/10/16 1546

## 2016-05-09 NOTE — ED Notes (Signed)
Pt transported to MRI 

## 2016-05-09 NOTE — ED Triage Notes (Signed)
Per [facility] called out for syncope after coworkers found the patient lying face down unresponsive.  EMS reports coworkers state the patient's face and hands were blue.  Coworkers initiated chest compressions and patient became responsive shortly after.  Patient states he was diagnosed with seizures in 2012 but hasn't had any seizures since then until 2 weeks ago when his fiance found him unresponsive on the floor.  Patient has no recollection of any unusual feeling prior to incidents.  Patient is alert and oriented at this time.  20g IV in left AC.

## 2016-05-10 ENCOUNTER — Observation Stay (HOSPITAL_BASED_OUTPATIENT_CLINIC_OR_DEPARTMENT_OTHER): Payer: BLUE CROSS/BLUE SHIELD

## 2016-05-10 ENCOUNTER — Telehealth: Payer: Self-pay | Admitting: Diagnostic Neuroimaging

## 2016-05-10 ENCOUNTER — Other Ambulatory Visit (HOSPITAL_COMMUNITY): Payer: Self-pay | Admitting: *Deleted

## 2016-05-10 ENCOUNTER — Observation Stay (HOSPITAL_COMMUNITY): Payer: BLUE CROSS/BLUE SHIELD

## 2016-05-10 DIAGNOSIS — G43909 Migraine, unspecified, not intractable, without status migrainosus: Secondary | ICD-10-CM | POA: Diagnosis not present

## 2016-05-10 DIAGNOSIS — R55 Syncope and collapse: Secondary | ICD-10-CM

## 2016-05-10 DIAGNOSIS — G40909 Epilepsy, unspecified, not intractable, without status epilepticus: Secondary | ICD-10-CM | POA: Diagnosis not present

## 2016-05-10 LAB — EXERCISE TOLERANCE TEST
CSEPED: 10 min
CSEPEDS: 2 s
CSEPEW: 10.4 METS
CSEPHR: 92 %
MPHR: 195 {beats}/min
Peak HR: 181 {beats}/min
Rest HR: 94 {beats}/min

## 2016-05-10 LAB — CBC
HEMATOCRIT: 45.5 % (ref 39.0–52.0)
HEMOGLOBIN: 16.1 g/dL (ref 13.0–17.0)
MCH: 30.9 pg (ref 26.0–34.0)
MCHC: 35.4 g/dL (ref 30.0–36.0)
MCV: 87.3 fL (ref 78.0–100.0)
Platelets: 264 10*3/uL (ref 150–400)
RBC: 5.21 MIL/uL (ref 4.22–5.81)
RDW: 12.2 % (ref 11.5–15.5)
WBC: 12.9 10*3/uL — AB (ref 4.0–10.5)

## 2016-05-10 LAB — LIPID PANEL
CHOL/HDL RATIO: 4.8 ratio
Cholesterol: 182 mg/dL (ref 0–200)
HDL: 38 mg/dL — AB (ref >40)
LDL CALC: 111 mg/dL — AB (ref 0–99)
Triglycerides: 163 mg/dL — ABNORMAL HIGH (ref <150)
VLDL: 33 mg/dL (ref 0–40)

## 2016-05-10 LAB — BASIC METABOLIC PANEL
Anion gap: 10 (ref 5–15)
BUN: 14 mg/dL (ref 6–20)
CHLORIDE: 102 mmol/L (ref 101–111)
CO2: 23 mmol/L (ref 22–32)
Calcium: 9.9 mg/dL (ref 8.9–10.3)
Creatinine, Ser: 1.16 mg/dL (ref 0.61–1.24)
GFR calc non Af Amer: >60 mL/min (ref >60)
Glucose, Bld: 153 mg/dL — ABNORMAL HIGH (ref 65–99)
POTASSIUM: 4.2 mmol/L (ref 3.5–5.1)
SODIUM: 135 mmol/L (ref 135–145)

## 2016-05-10 LAB — ECHOCARDIOGRAM COMPLETE
Height: 74 in
Weight: 4224 oz

## 2016-05-10 LAB — TSH: TSH: 0.416 u[IU]/mL (ref 0.350–4.500)

## 2016-05-10 LAB — GLUCOSE, CAPILLARY: GLUCOSE-CAPILLARY: 119 mg/dL — AB (ref 65–99)

## 2016-05-10 MED ORDER — ENOXAPARIN SODIUM 60 MG/0.6ML ~~LOC~~ SOLN: 60.0000 mg | SUBCUTANEOUS | Status: DC

## 2016-05-10 NOTE — Telephone Encounter (Signed)
I spoke with Brent Austin to inform him since he had the MRI Brain w/wo contrast at the hospital I canceled the one that we had scheduled for tomorrow on the [facility] mobile unit. He also asked him if he could see Dr. Marjory LiesPenumalli sooner in January. The best number to contact the patient is [phone]

## 2016-05-10 NOTE — Progress Notes (Signed)
EEG completed, results pending. 

## 2016-05-10 NOTE — Discharge Instructions (Signed)
Syncope °Introduction °Syncope is when you lose temporarily pass out (faint). Signs that you may be about to pass out include: °· Feeling dizzy or light-headed. °· Feeling sick to your stomach (nauseous). °· Seeing all white or all black. °· Having cold, clammy skin. °If you passed out, get help right away. Call your local emergency services (911 in the [place] Do not drive yourself to the hospital. °Follow these instructions at home: °Pay attention to any changes in your symptoms. Take these actions to help with your condition: °· Have someone stay with you until you feel stable. °· Do not drive, use machinery, or play sports until your doctor says it is okay. °· Keep all follow-up visits as told by your doctor. This is important. °· If you start to feel like you might pass out, lie down right away and raise (elevate) your feet above the level of your heart. Breathe deeply and steadily. Wait until all of the symptoms are gone. °· Drink enough fluid to keep your pee (urine) clear or pale yellow. °· If you are taking blood pressure or heart medicine, get up slowly and spend many minutes getting ready to sit and then stand. This can help with dizziness. °· Take over-the-counter and prescription medicines only as told by your doctor. °Get help right away if: °· You have a very bad headache. °· You have unusual pain in your chest, tummy, or back. °· You are bleeding from your mouth or rectum. °· You have black or tarry poop (stool). °· You have a very fast or uneven heartbeat (palpitations). °· It hurts to breathe. °· You pass out once or more than once. °· You have jerky movements that you cannot control (seizure). °· You are confused. °· You have trouble walking. °· You are very weak. °· You have vision problems. °These symptoms may be an emergency. Do not wait to see if the symptoms will go away. Get medical help right away. Call your local emergency services (911 in the [place] Do not drive yourself to the hospital.    °This information is not intended to replace advice given to you by your health care provider. Make sure you discuss any questions you have with your health care provider. °Document Released: 11/09/2007 Document Revised: 10/29/2015 Document Reviewed: 02/04/2015 °© 2017 Elsevier ° °

## 2016-05-10 NOTE — Discharge Summary (Signed)
[facility]  Patient name: Brent CulverJustin J Sizemore Medical record number: [id number] Date of birth: Apr 17, 1991 Age: 25 y.o. Gender: male Date of Admission: 05/09/2016  Date of Discharge: 05/10/16  Admitting Physician: Uvaldo RisingKyle J Fletke, MD  Primary Care Provider: PROVIDER NOT IN SYSTEM Consultants: neurology, cardiology  Indication for Hospitalization: syncope  Discharge Diagnoses/Problem List:  Syncope  Disposition: home  Discharge Condition: stable, improved  Discharge Exam: see progress note from 05/10/16  Brief Hospital Course:  Toni ArthursJustin Szczesny is a 25 year old with a PMH of migraines who [facility] after a syncopal event at work. This was his second syncopal event in 2 weeks. He had seen neurology outpatient and had a normal EEG. [facility]. Work up including a repeat EEG that was normal, normal echocardiogram, negative orthostatic vital signs. 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 Troponin was negative. Cardiology saw the patient and performed and exercise treadmill test (ETT) that was normal. Cardiology felt he was safe for discharge home from a cardiac standpoint. He was discharged home with [facility].  Issues for Follow Up:  1. Follow up A1C, lipid panel results 2. [facility] 3. Potentially needs further ischemic work up for left anterior fascicular block   Significant Procedures: none  Significant Labs and Imaging:   Recent Labs Lab 05/09/16 0940 05/10/16 0403  WBC 6.7 12.9*  HGB 15.5 16.1  HCT 44.3 45.5  PLT 218 264    Recent Labs Lab 05/09/16 0940 05/10/16 0403  NA 140 135  K 4.2 4.2  CL 107 102  CO2 24 23  GLUCOSE 93 153*  BUN 10 14  CREATININE 1.07 1.16  CALCIUM 9.5 9.9  ALKPHOS 64  --   AST 44*  --   ALT 55  --   ALBUMIN 4.3  --    Ct Head Wo Contrast  Result  Date: 05/09/2016 CLINICAL DATA:  Patient found down at work today. EXAM: CT HEAD WITHOUT CONTRAST TECHNIQUE: Contiguous axial images were obtained from the base of the skull through the vertex without intravenous contrast. COMPARISON:  04/08/2016 FINDINGS: Brain: There is no evidence for acute hemorrhage, hydrocephalus, mass lesion, or abnormal extra-axial fluid collection. No definite CT evidence for acute infarction. Vascular: No hyperdense vessel or unexpected calcification. Skull: No evidence for fracture. No worrisome lytic or sclerotic lesion. Sinuses/Orbits: The visualized paranasal sinuses and mastoid air cells are clear. Visualized portions of the globes and intraorbital fat are unremarkable. Other: None. IMPRESSION: Normal CT scan of the brain. Electronically Signed   By: Kennith CenterEric  Mansell M.D.   On: 05/09/2016 10:47   Mr Laqueta JeanBrain W And Wo Contrast  Result Date: 05/09/2016 CLINICAL DATA:  Syncope. Found down and unresponsive. History of seizure disorder. EXAM: MRI HEAD WITHOUT AND WITH CONTRAST TECHNIQUE: Multiplanar, multiecho pulse sequences of the brain and surrounding structures were obtained without and with intravenous contrast. CONTRAST:  20mL MULTIHANCE GADOBENATE DIMEGLUMINE 529 MG/ML IV SOLN COMPARISON:  Head CT from earlier today FINDINGS: Brain: No acute or remote infarction, hemorrhage, hydrocephalus, extra-axial collection or mass lesion. No abnormal enhancement. Symmetric normal hippocampal formations. No cortical finding to explain seizure. Vascular: Normal flow voids Skull and upper cervical spine: Negative Sinuses/Orbits: Negative IMPRESSION: Normal brain MRI. Electronically Signed   By: Marnee SpringJonathon  Watts M.D.   On: 05/09/2016 14:15   Echocardiogram: - Left ventricle:  The cavity size was normal. Systolic function was   normal. The estimated ejection fraction was in the range of 55%   to 60%. Wall motion was normal; there were no regional wall   motion abnormalities.   Exercise  treadmill test:There was no ST segment deviation noted during stress.  No T wave inversion was noted during stress.  Patient had an excellent exercise capacity, achieving over 10 METS. No chest discomfort, significant dyspnea, palpitations, or lightheadedness noted. EKG with no acute ST or T-wave changes. Occasional PVC's but no significant arrhythmias. Overall, a low-risk ETT.  EEG: This awake and drowsy EEG is normal.    Results/Tests Pending at Time of Discharge: A1C, Lipid panel  Discharge Medications:    Medication List    TAKE these medications   topiramate 50 MG tablet Commonly known as:  TOPAMAX Take 1-2 tablets (50-100 mg total) by mouth 2 (two) times daily. What changed:  how much to take       Discharge Instructions: Please refer to Patient Instructions section of EMR for full details.  Patient was counseled important signs and symptoms that should prompt return to medical care, changes in medications, dietary instructions, activity restrictions, and follow up appointments.   Follow-Up Appointments: Follow-up Information    Renne Muscaaniel L Warden, MD. Go on 05/17/2016.   Why:  at 2 pm Contact information: [address] [place] [address] [phone]        Joycelyn SchmidPENUMALLI,VIKRAM, MD. Schedule an appointment as soon as possible for a visit.   Specialties:  Neurology, Radiology Why:  after discharge for follow up Contact information: [address] [place] [address] [phone]           Tillman Sersngela C Parthenia Tellefsen, DO 05/10/2016, 12:14 PM PGY-1, [facility]

## 2016-05-10 NOTE — Telephone Encounter (Signed)
[facility] called to schedule f/u appt for pt. Pt has appt f/u on 07/25/16. He was seen in ED yesterday for syncope and collapse, he is being discharged today . Pt was last seen 11/17, EEG 11/29. Does this pt need to be seen sooner? Please call

## 2016-05-10 NOTE — Procedures (Signed)
ELECTROENCEPHALOGRAM REPORT  Date of Study: 05/10/2016  Patient's Name: Brent Austin MRN: [id number] Date of Birth: 28-Jan-1991  Referring Provider: Dolores PattyAngela Riccio, DO  Clinical History: 25 year old male with history of migraine and multiple concussions with recurrent episodes of syncope and unresponsiveness.  Medications: Hospital Medications L1 acetaminophen (TYLENOL) suppository 650 mg  L1 acetaminophen (TYLENOL) tablet 650 mg   enoxaparin (LOVENOX) injection 30 mg   sodium chloride flush (NS) 0.9 % injection 3 mL   topiramate (TOPAMAX) tablet 50 mg    Technical Summary: A multichannel digital EEG recording measured by the international 10-20 system with electrodes applied with paste and impedances below 5000 ohms performed in our laboratory with EKG monitoring in an awake and drowsy patient.  Hyperventilation and photic stimulation were  performed.  The digital EEG was referentially recorded, reformatted, and digitally filtered in a variety of bipolar and referential montages for optimal display.    Description: The patient is awake and drowsy during the recording.  During maximal wakefulness, there is a symmetric, medium voltage 10 Hz posterior dominant rhythm that attenuates with eye opening.  The record is symmetric.  During drowsiness and sleep, there is an increase in theta slowing of the background.  Stage 2 sleep was not seen.  Hyperventilation and photic stimulation did not elicit any abnormalities.  There were no epileptiform discharges or electrographic seizures seen.    EKG lead was unremarkable.  Impression: This awake and drowsy EEG is normal.    Clinical Correlation: A normal EEG does not exclude a clinical diagnosis of epilepsy.  If further clinical questions remain, prolonged EEG may be helpful.  Clinical correlation is advised.   Shon MilletAdam Jaffe, DO

## 2016-05-10 NOTE — Progress Notes (Signed)
Patient ID: Brent CulverJustin J Gaw, male   DOB: 10-10-1990, 25 y.o.   MRN: [id number]   Patient Name: Brent Austin Date of Encounter: 05/10/2016  [facility]   Hospital Problem List     Active Problems:   Syncope and collapse     Subjective   No cardiac complaints  Inpatient Medications    Scheduled Meds: . enoxaparin (LOVENOX) injection  30 mg Subcutaneous Q24H  . sodium chloride flush  3 mL Intravenous Q12H  . topiramate  50 mg Oral BID   Continuous Infusions:  PRN Meds: acetaminophen **OR** acetaminophen   Vital Signs    Vitals:   05/09/16 1554 05/09/16 1949 05/10/16 0010 05/10/16 0637  BP: 132/74 130/75 130/76 (!) 109/53  Pulse: (!) 46 87 85 83  Resp: 18 18 18 18   Temp: 98.3 F (36.8 C) 98.4 F (36.9 C) 98.2 F (36.8 C) 98 F (36.7 C)  TempSrc: Oral Oral Oral Oral  SpO2: 96% 96% 97% 93%  Weight:    119.7 kg (264 lb)  Height:        Intake/Output Summary (Last 24 hours) at 05/10/16 0802 Last data filed at 05/10/16 0600  Gross per 24 hour  Intake              363 ml  Output                0 ml  Net              363 ml   Filed Weights   05/09/16 1542 05/10/16 0637  Weight: 120.4 kg (265 lb 8 oz) 119.7 kg (264 lb)    Physical Exam    GEN: Well nourished, well developed, in no acute distress.  HEENT: Grossly normal.  Neck: Supple, no JVD, carotid bruits, or masses. Cardiac: RRR, no murmurs, rubs, or gallops. No clubbing, cyanosis, edema.  Radials/DP/PT 2+ and equal bilaterally.  Respiratory:  Respirations regular and unlabored, clear to auscultation bilaterally. GI: Soft, nontender, nondistended, BS + x 4. MS: no deformity or atrophy. Skin: warm and dry, no rash. Neuro:  Strength and sensation are intact. Psych: AAOx3.  Normal affect.  Labs    CBC  Recent Labs  05/09/16 0940 05/10/16 0403  WBC 6.7 12.9*  NEUTROABS 3.4  --   HGB 15.5 16.1  HCT 44.3 45.5  MCV 88.6 87.3  PLT 218 264   Basic Metabolic Panel  Recent Labs  05/09/16 0940 05/10/16 0403  NA 140 135  K 4.2 4.2  CL 107 102  CO2 24 23  GLUCOSE 93 153*  BUN 10 14  CREATININE 1.07 1.16  CALCIUM 9.5 9.9   Liver Function Tests  Recent Labs  05/09/16 0940  AST 44*  ALT 55  ALKPHOS 64  BILITOT 0.9  PROT 6.9  ALBUMIN 4.3   No results for input(s): LIPASE, AMYLASE in the last 72 hours. Cardiac Enzymes No results for input(s): CKTOTAL, CKMB, CKMBINDEX, TROPONINI in the last 72 hours. BNP Invalid input(s): POCBNP D-Dimer No results for input(s): DDIMER in the last 72 hours. Hemoglobin A1C No results for input(s): HGBA1C in the last 72 hours. Fasting Lipid Panel No results for input(s): CHOL, HDL, LDLCALC, TRIG, CHOLHDL, LDLDIRECT in the last 72 hours. Thyroid Function Tests  Recent Labs  05/10/16 0403  TSH 0.416    Telemetry    NSR 05/10/2016  - Personally Reviewed  ECG    NSR normal  - Personally Reviewed  Radiology    Ct  Head Wo Contrast  Result Date: 05/09/2016 CLINICAL DATA:  Patient found down at work today. EXAM: CT HEAD WITHOUT CONTRAST TECHNIQUE: Contiguous axial images were obtained from the base of the skull through the vertex without intravenous contrast. COMPARISON:  04/08/2016 FINDINGS: Brain: There is no evidence for acute hemorrhage, hydrocephalus, mass lesion, or abnormal extra-axial fluid collection. No definite CT evidence for acute infarction. Vascular: No hyperdense vessel or unexpected calcification. Skull: No evidence for fracture. No worrisome lytic or sclerotic lesion. Sinuses/Orbits: The visualized paranasal sinuses and mastoid air cells are clear. Visualized portions of the globes and intraorbital fat are unremarkable. Other: None. IMPRESSION: Normal CT scan of the brain. Electronically Signed   By: Kennith CenterEric  Mansell M.D.   On: 05/09/2016 10:47   Mr Laqueta JeanBrain W And Wo Contrast  Result Date: 05/09/2016 CLINICAL DATA:  Syncope. Found down and unresponsive. History of seizure disorder. EXAM: MRI HEAD WITHOUT AND  WITH CONTRAST TECHNIQUE: Multiplanar, multiecho pulse sequences of the brain and surrounding structures were obtained without and with intravenous contrast. CONTRAST:  20mL MULTIHANCE GADOBENATE DIMEGLUMINE 529 MG/ML IV SOLN COMPARISON:  Head CT from earlier today FINDINGS: Brain: No acute or remote infarction, hemorrhage, hydrocephalus, extra-axial collection or mass lesion. No abnormal enhancement. Symmetric normal hippocampal formations. No cortical finding to explain seizure. Vascular: Normal flow voids Skull and upper cervical spine: Negative Sinuses/Orbits: Negative IMPRESSION: Normal brain MRI. Electronically Signed   By: Marnee SpringJonathon  Watts M.D.   On: 05/09/2016 14:15    Cardiac Studies   Pending ETT and Echo   Patient Profile     25 y.o. with syncope   Assessment & Plan    Syncope:  No cardiac red flags ECG and exam normal no high risk family history for ETT and Echo today IP neurology consult pending EEG normal in November and MRI brain normal on admission   Signed, Charlton HawsPeter Ricca Melgarejo, MD  05/10/2016, 8:02 AM

## 2016-05-10 NOTE — Progress Notes (Signed)
Family Medicine Teaching Service Daily Progress Note Intern Pager: [phone]  Patient name: Roselee CulverJustin J Blankenbeckler Medical record number: [id number] Date of birth: 31-Jul-1990 Age: 25 y.o. Gender: male  Primary Care Provider: PROVIDER NOT IN SYSTEM Consultants: cardiology Code Status: full  Pt Overview and Major Events to Date:  05/09/16- admitted to FPTS  Assessment and Plan: Roselee CulverJustin J Geremia is a 25 y.o. male presenting with syncopal event. PMH is significant for migraines, syncopal event in 2012, hx of concussion.  Syncopal event- second event in 2 weeks, has seen neurology outpatient with normal EEG 04/25/16. Recently started on Topamax for migraines, feels like this made things worse. Concern due to no prodrome prior to episodes, patient cannot remember events leading up to syncope, appears to not be breathing and has had CPR after both events by fiance and coworkers. No seizure like activity or loss of bladder/bowels. No post-ictal state. Unknown amount of time without consciousness. Differential includes cardiac etiology such as HCOM, arrythmia, vs vasovagal event. Unlikely to be seizure or dehydration. CT head negative, MRI head negative. Vital signs stable in ED, no signs of infectious etiology. UDS negative, Ethanol level <5. EKG normal sinus rhythm with left axis deviation however artifact noted, troponin negative at 0.0 - Continuous cardiac monitoring - Vitals per unit - Seizure precautions - Cardiology following, appreciate recommendations. Plan for ETT today - Spoke with neurology and recommended repeat EEG, outpatient follow up - Repeat EKG this morning with normal sinus rhythm, left anterior fascicular block- will need further ischemic work up - Echocardiogram pending - CBC wnl this morning, hemoglobin 16.1, wbc mildly elevated at 12.9- likely stress, no signs or symptoms of active infection, afebrile. - BMP normal, slightly elevated CBG - TSH normal, 0.416 - Orthostatic vitals  negative -will check A1C and lipid panel  Migraines- recently started on Topamax, felt like this brought on migraine, stopped this medication for 1 week. Started taking it again day before syncopal event. Concern for symptomatic migraines -Continue topamax -Tylenol as needed for pain  FEN/GI: regular diet Prophylaxis: lovenox  Disposition: home pending work up  Subjective:  Mr. Burnadette PeterLynch is feeling well this morning. Headache resolved with migraine cocktail. He is eager to go home after testing is done.   Objective: Temp:  [98 F (36.7 C)-98.4 F (36.9 C)] 98 F (36.7 C) (12/05 0637) Pulse Rate:  [46-97] 83 (12/05 0637) Resp:  [18-20] 18 (12/05 0637) BP: (109-141)/(53-105) 109/53 (12/05 0637) SpO2:  [93 %-100 %] 93 % (12/05 0637) Weight:  [264 lb (119.7 kg)-265 lb 8 oz (120.4 kg)] 264 lb (119.7 kg) (12/05 [id number]) Physical Exam: General: well developed well nourished in no acute distress Cardiovascular: RRR no murmurs rubs or gallops Respiratory: CTA bilaterally no increased work of breathing Abdomen: soft, non-tender, non-distended, +BS Extremities: warm, well perfused. No edema or cyanosis  Laboratory:  Recent Labs Lab 05/09/16 0940 05/10/16 0403  WBC 6.7 12.9*  HGB 15.5 16.1  HCT 44.3 45.5  PLT 218 264    Recent Labs Lab 05/09/16 0940 05/10/16 0403  NA 140 135  K 4.2 4.2  CL 107 102  CO2 24 23  BUN 10 14  CREATININE 1.07 1.16  CALCIUM 9.5 9.9  PROT 6.9  --   BILITOT 0.9  --   ALKPHOS 64  --   ALT 55  --   AST 44*  --   GLUCOSE 93 153*   TSH 0.416  Imaging/Diagnostic Tests: Ct Head Wo Contrast  Result Date: 05/09/2016 CLINICAL DATA:  Patient found down at work today. EXAM: CT HEAD WITHOUT CONTRAST TECHNIQUE: Contiguous axial images were obtained from the base of the skull through the vertex without intravenous contrast. COMPARISON:  04/08/2016 FINDINGS: Brain: There is no evidence for acute hemorrhage, hydrocephalus, mass lesion, or abnormal extra-axial  fluid collection. No definite CT evidence for acute infarction. Vascular: No hyperdense vessel or unexpected calcification. Skull: No evidence for fracture. No worrisome lytic or sclerotic lesion. Sinuses/Orbits: The visualized paranasal sinuses and mastoid air cells are clear. Visualized portions of the globes and intraorbital fat are unremarkable. Other: None. IMPRESSION: Normal CT scan of the brain. Electronically Signed   By: Kennith CenterEric  Mansell M.D.   On: 05/09/2016 10:47   Mr Laqueta JeanBrain W And Wo Contrast  Result Date: 05/09/2016 CLINICAL DATA:  Syncope. Found down and unresponsive. History of seizure disorder. EXAM: MRI HEAD WITHOUT AND WITH CONTRAST TECHNIQUE: Multiplanar, multiecho pulse sequences of the brain and surrounding structures were obtained without and with intravenous contrast. CONTRAST:  20mL MULTIHANCE GADOBENATE DIMEGLUMINE 529 MG/ML IV SOLN COMPARISON:  Head CT from earlier today FINDINGS: Brain: No acute or remote infarction, hemorrhage, hydrocephalus, extra-axial collection or mass lesion. No abnormal enhancement. Symmetric normal hippocampal formations. No cortical finding to explain seizure. Vascular: Normal flow voids Skull and upper cervical spine: Negative Sinuses/Orbits: Negative IMPRESSION: Normal brain MRI. Electronically Signed   By: Marnee SpringJonathon  Watts M.D.   On: 05/09/2016 14:15     Tillman Sersngela C Ziyana Morikawa, DO 05/10/2016, 8:06 AM PGY-1, [facility] FPTS Intern pager: [phone], text pages welcome

## 2016-05-10 NOTE — Telephone Encounter (Signed)
Ok to see me or NP in January 2018. Also needs PCP follow up. -VRP

## 2016-05-10 NOTE — Progress Notes (Signed)
Patient given discharge instructions and all questions answered.  Pt. Discharged via wheelchair with all belongings.   

## 2016-05-10 NOTE — Progress Notes (Signed)
  Echocardiogram 2D Echocardiogram has been performed.  Brent SavoyCasey N [name] 05/10/2016, 11:33 AM

## 2016-05-11 ENCOUNTER — Other Ambulatory Visit: Payer: BLUE CROSS/BLUE SHIELD

## 2016-05-11 LAB — HEMOGLOBIN A1C
Hgb A1c MFr Bld: 5.1 % (ref 4.8–5.6)
MEAN PLASMA GLUCOSE: 100 mg/dL

## 2016-05-11 NOTE — Telephone Encounter (Signed)
Per Dr Marjory LiesPenumalli, LVM informing patient he may see NP in Jan if Dr Danae OrleansPenumali does not have any openings. Also advised that he was to establish with a PCP to mange his health. Requested he call back and unless he has questions for this RN, phone staff may schedule his ED follow up with NP in Jan 2018. Requested he advise this office if he has PCP so the information can be added to his chart. Left number for call back.

## 2016-05-12 NOTE — Telephone Encounter (Signed)
LVM #2 requesting patient call back to schedule follow up with NP in Jan 2018. Left number for call back. Phone staff may schedule.

## 2016-05-17 ENCOUNTER — Inpatient Hospital Stay: Payer: BLUE CROSS/BLUE SHIELD | Admitting: Family Medicine

## 2016-05-24 NOTE — Telephone Encounter (Signed)
Spoke with patient to schedule earlier followup with NP per Dr Marjory LiesPenumalli. Patient stated his fiancee is expecting a baby on Jan 8th, so he requested to schedule FU a few weeks later. She has to drive him to appts. Scheduled him for 06/28/16 with Dolores HooseM Millikan, NP. He verbalized understanding, appreciation.

## 2016-06-28 ENCOUNTER — Encounter: Payer: Self-pay | Admitting: Adult Health

## 2016-06-28 ENCOUNTER — Ambulatory Visit (INDEPENDENT_AMBULATORY_CARE_PROVIDER_SITE_OTHER): Payer: BLUE CROSS/BLUE SHIELD | Admitting: Adult Health

## 2016-06-28 VITALS — BP 134/84 | HR 77 | Ht 74.0 in | Wt 270.2 lb

## 2016-06-28 DIAGNOSIS — R55 Syncope and collapse: Secondary | ICD-10-CM

## 2016-06-28 DIAGNOSIS — R569 Unspecified convulsions: Secondary | ICD-10-CM | POA: Diagnosis not present

## 2016-06-28 DIAGNOSIS — R4020 Unspecified coma: Secondary | ICD-10-CM | POA: Diagnosis not present

## 2016-06-28 NOTE — Patient Instructions (Signed)
Continue Topamax  Establish with a PCP  - According to [place] law, you can not drive unless you are seizure free for at least 6 months and under physician's care.   - Please maintain seizure precautions. Do not participate in activities where a loss of awareness could harm you or someone else. No swimming alone, no tub bathing, no hot tubs, no driving, no operating motorized vehicles (cars, ATVs, motocycles, etc), lawnmowers or power tools. No standing at heights, such as rooftops, ladders or stairs. Avoid hot objects such as stoves, heaters, open fires. Wear a helmet when riding a bicycle, scooter, skateboard, etc. and avoid areas of traffic. Set your water heater to 120 degrees or less.

## 2016-06-28 NOTE — Progress Notes (Signed)
PATIENT: Brent Austin DOB: [birth year]  REASON FOR VISIT: follow up HISTORY FROM: patient  HISTORY OF PRESENT ILLNESS:  Today 06/28/2016: Brent Austin is a 26 year old male with a history of syncope and loss of consciousness. He returns today for follow-up. He was placed on Topamax for potential seizures. The patient states that he has done well on this medication. He states that there was a period that he was having a hard time tolerating Topamax and therefore stopped the medication. He states that when he was off the medication he had another syncopal event. He was taken to [facility]. He states that the event was witnessed by his girlfriend. She found him face first on the ground in their home. Fiance reported that he was not breathing. CPR initiated. There was no witness convulsing, no loss of bladder or bowel and he did not bite his tongue. At the hospital the patient underwent several tests all which was unremarkable. He was placed back on Topamax. He states that since then he's not had any additional syncopal events. He is tolerating the Topamax well. He does not have a primary care provider and for that reason has not followed up as instructed at the last visit. He returns today for an evaluation.  HISTORY 04/22/16: 26 year old right-handed male here for evaluation of blackout spell.  July 2012 patient had a blackout episode which was possibly provoked by being overheated and dehydrated. Apparently he had some shaking seizure-like activity. He is evaluated by neurologist who performed MRI and EEG which were unremarkable. Patient was diagnosed with possible new onset syncope versus seizure and monitored.  Patient did well until 04/17/16 when he was at home, getting out of the shower and then felt a headache. He took 2 ibuprofen and then sat down for dinner. Apparently he was moving his food around but does not recall doing this. His girlfriend went to the other room and when she  came back found him passed out on the floor. He had some blood coming from his mouth. No definite convulsions. When he woke up he was crying and in a panic. Girlfriend called EMS and while waiting for them to arrive patient blacked out a second time. Patient's girlfriend performed CPR for 2 or 3 chest compressions and then patient woke back up. He then regained memory paramedics arrived. He is feeling better and opted not to go to the hospital at that time. He went to bed and the next day woke up feeling dizzy. Therefore he went to the emergency room for evaluation. CT scan and lab testing were performed the patient was discharged after 3-4 hours.  Since that time patient has felt fine. No other symptoms. No family history of seizure. Patient has history of concussion while playing football when he was younger.  Patient has history of headaches and migraines for some years. He describes 2 kinds of headaches. First type is in every other day mild or headache which is not that bothersome. He also has a severe left-sided headache which happens every 2-3 months associated with photophobia, phonophobia and nausea.  Patient works for an [employer], in a [employer], occupational, lifting boxes weighing 60 pounds, and driving to various locations around [place].   REVIEW OF SYSTEMS: Out of a complete 14 system review of symptoms, the patient complains only of the following symptoms, and all other reviewed systems are negative.  Memory loss, dizziness, headache, seizures, speech difficulty, passing out  ALLERGIES: No  Known Allergies  HOME MEDICATIONS: Outpatient Medications Prior to Visit  Medication Sig Dispense Refill  . topiramate (TOPAMAX) 50 MG tablet Take 1-2 tablets (50-100 mg total) by mouth 2 (two) times daily. (Patient taking differently: Take 50 mg by mouth 2 (two) times daily. ) 120 tablet 12   No facility-administered medications prior to visit.      PAST MEDICAL HISTORY: Past Medical History:  Diagnosis Date  . Concussion 04/2009   football, [place]  . Migraine   . Passed out 04/17/2016   blacked out 12/2010, no diagnosis found  . Seizures (HCC)     PAST SURGICAL HISTORY: Past Surgical History:  Procedure Laterality Date  . NO PAST SURGERIES      FAMILY HISTORY: No family history on file.  SOCIAL HISTORY: Social History   Social History  . Marital status: Single    Spouse name: N/A  . Number of children: 1  . Years of education: 1912   Occupational History  .      [employer]- distributor- insulation   Social History Main Topics  . Smoking status: Never Smoker  . Smokeless tobacco: Never Used  . Alcohol use Yes     Comment: 04/22/16 once a week  . Drug use: No  . Sexual activity: Not on file   Other Topics Concern  . Not on file   Social History Narrative   Lives with girl friend   Caffeine- none      PHYSICAL EXAM  Vitals:   06/28/16 1033  BP: 134/84  Pulse: 77  Weight: 270 lb 3.2 oz (122.6 kg)  Height: 6\' 2"  (1.88 m)   Body mass index is 34.69 kg/m.  Generalized: Well developed, in no acute distress   Neurological examination  Mentation: Alert oriented to time, place, history taking. Follows all commands speech and language fluent Cranial nerve II-XII: Pupils were equal round reactive to light. Extraocular movements were full, visual field were full on confrontational test. Facial sensation and strength were normal. Uvula tongue midline. Head turning and shoulder shrug  were normal and symmetric. Motor: The motor testing reveals 5 over 5 strength of all 4 extremities. Good symmetric motor tone is noted throughout.  Sensory: Sensory testing is intact to soft touch on all 4 extremities. No evidence of extinction is noted.  Coordination: Cerebellar testing reveals good finger-nose-finger and heel-to-shin bilaterally.  Gait and station: Gait is normal. Tandem gait is normal. Romberg is negative. No  drift is seen.  Reflexes: Deep tendon reflexes are symmetric and normal bilaterally.   DIAGNOSTIC DATA (LABS, IMAGING, TESTING) - I reviewed patient records, labs, notes, testing and imaging myself where available.  Lab Results  Component Value Date   WBC 12.9 (H) 05/10/2016   HGB 16.1 05/10/2016   HCT 45.5 05/10/2016   MCV 87.3 05/10/2016   PLT 264 05/10/2016      Component Value Date/Time   NA 135 05/10/2016 0403   K 4.2 05/10/2016 0403   CL 102 05/10/2016 0403   CO2 23 05/10/2016 0403   GLUCOSE 153 (H) 05/10/2016 0403   BUN 14 05/10/2016 0403   CREATININE 1.16 05/10/2016 0403   CALCIUM 9.9 05/10/2016 0403   PROT 6.9 05/09/2016 0940   ALBUMIN 4.3 05/09/2016 0940   AST 44 (H) 05/09/2016 0940   ALT 55 05/09/2016 0940   ALKPHOS 64 05/09/2016 0940   BILITOT 0.9 05/09/2016 0940   GFRNONAA >60 05/10/2016 0403   GFRAA >60 05/10/2016 0403   Lab Results  Component  Value Date   CHOL 182 05/10/2016   HDL 38 (L) 05/10/2016   LDLCALC 111 (H) 05/10/2016   TRIG 163 (H) 05/10/2016   CHOLHDL 4.8 05/10/2016   Lab Results  Component Value Date   HGBA1C 5.1 05/10/2016   No results found for: [id number] Lab Results  Component Value Date   TSH 0.416 05/10/2016      ASSESSMENT AND PLAN 26 y.o. year old male  has a past medical history of Concussion (04/2009); Migraine; Passed out (04/17/2016); and Seizures (HCC). here with:  1. Seizures 2. Syncope and collapse 3. Loss of consciousness  The patient reports he had another syncopal event while off of Topamax. He is advised that he should continue on Topamax. He is also encouraged to establish care with a primary care provider to workup medical/cardiac causes of syncope.  advised that he should not operate a motor vehicle until he is seizure-free for 6 months. We  reviewed seizure precautions as discussed on his AVS. Patient will follow-up in 3 months or sooner if needed.  I spent 25 minutes with patient 50% of this time was  spent discussing his diagnosis as well as seizure precautions.    Butch Penny, MSN, NP-C 06/28/2016, 10:45 AM [facility] [address], [address] [place], [place] [address] [phone]

## 2016-07-13 ENCOUNTER — Telehealth: Payer: Self-pay | Admitting: Diagnostic Neuroimaging

## 2016-07-13 NOTE — Telephone Encounter (Signed)
Pt called said he is wanting to know if disability forms were rec'd from [employer]. Please call

## 2016-07-13 NOTE — Telephone Encounter (Signed)
Stanton KidneyDebra came to the phone room and advised the forms were faxed today. Operator called the pt and relayed the msg. He said thank you!

## 2016-07-19 ENCOUNTER — Telehealth: Payer: Self-pay | Admitting: Adult Health

## 2016-07-19 NOTE — Telephone Encounter (Signed)
Vernona RiegerLaura with [employer] is calling in reference to short term disability.   Vernona RiegerLaura is questioning if patient is able to return to work or are there restrictions as far as him not being able to return to work.  Please call

## 2016-07-20 NOTE — Progress Notes (Signed)
I reviewed note and agree with plan.   VIKRAM R. PENUMALLI, MD  Certified in Neurology, Neurophysiology and Neuroimaging  [facility] [address], [address] [place], [place] [address] [phone]   

## 2016-07-20 NOTE — Telephone Encounter (Signed)
I spoke to [place] with [employer].  They requested MR and received MM/NP last note.  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  I told her that [place] law states no driving for 6 months.  ( no climbing, using ladders, or swimming alone).  She states she will touch base with pt and his employer about doing something else on his job.  I gave her my name and title. She will call back if more questions.

## 2016-07-25 ENCOUNTER — Ambulatory Visit: Payer: BLUE CROSS/BLUE SHIELD | Admitting: Diagnostic Neuroimaging

## 2016-08-16 ENCOUNTER — Telehealth: Payer: Self-pay | Admitting: Neurology

## 2016-08-16 NOTE — Telephone Encounter (Signed)
The patient called, [facility] The patient felt dizzy, lightheaded, he recalls starting to fall to the ground, bumping into a shelf. The patient was unconscious only for a few moments, the event was witnessed, no jerking was noted. The patient is not bite his tongue or lose control of the bowels or the bladder. He did feel clammy and flushed and hot after the event.  The event appears to be consistent with a vasovagal syncopal episode. The patient was told to call and report any further events. I will pass this message on to Dr. Marjory LiesPenumalli.

## 2016-08-16 NOTE — Telephone Encounter (Signed)
Spoke with patient and advised that Dr Marjory LiesPenumalli stated he needs to see a PCP asap. Patient stated he never pursued establishing PCP in [place] because he began to feel better, had no headaches or further episodes since Jan. This RN advised to get in touch with [facility] or [facility]. Patient stated that he cannot drive, his wife works full time now following maternity leave, and he watches their baby born in Jan. This RN emphasized the importance of him being evaluated very soon, for his safety, baby's safety. He stated that he had called drs in [place] in January and was told they did not accept his insurance. He stated the only ones that do are further away on edges of [place], and he stated it just makes it very difficult to go. He stated that his wife has to give 2 weeks notice to take any time off; they have no family in this area. This RN reemphasized the importance of him being evaluated asap, discussed again the potential harm if he does not find out what is causing these episodes.  Patient agreed, stated he is worried about his situation. He stated he would make phone calls and try to find a PCP. This RN suggested he also look into transportation [facility] offers patients that need to get to appointments.  Patient verbalized understanding, agreement.

## 2016-08-16 NOTE — Telephone Encounter (Signed)
Please instruct patient to go to PCP asap. Give information for [facility] or community [facility] clinic. He needs medical and cardiac workup for syncope. -VRP

## 2016-09-26 ENCOUNTER — Telehealth: Payer: Self-pay

## 2016-09-26 ENCOUNTER — Ambulatory Visit: Payer: BLUE CROSS/BLUE SHIELD | Admitting: Adult Health

## 2016-09-26 NOTE — Telephone Encounter (Signed)
Patient should not operate a motor vehicle until he is seizure-free for 6 months. I have not requested that the DMV suspend his license. Please follow up with the patient if he has scheduled an appointment with his primary care provider. Also ask if he's had any additional syncopal events? Hopefully he can find someone to bring him to an appointment on April 25 at 10:30.

## 2016-09-26 NOTE — Telephone Encounter (Addendum)
Opened in error

## 2016-09-26 NOTE — Telephone Encounter (Signed)
I spoke to patient. He does not have any new ppw to be filled out. He received an extension on the disability date. I have r/s him to next available appt on 5/30.

## 2016-09-26 NOTE — Telephone Encounter (Signed)
I called pt to get him rescheduled for his appt with Aundra Millet, NP this morning. (Megan called pt on Monday to tell him to not come to this visit because she will be unavailable.)  I offered pt an appt on 09/28/2016 at 10:30am but pt is unsure if he will have family able to drive him but he will call us back before noon to confirm this appt. The appt has been held until noon and pt is aware of this.  Pt is also asking about his license, whether it was suspended or still active? He knows he is not to drive for 6 months after his last seizure. He says that he had to go to court for a speeding ticket back from September 2017 and the court told him that he should have received a notice in the mail from [facility] if his license was indeed suspended. However, pt has never received this letter. Pt might need to call the DMV, but he wanted me to ask Aundra Millet her opinion on this. 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  Of note, pt cannot wait until the next available appt with Aundra Millet, NP of May 23rd to be seen, because his disability paperwork needs to be completed before then.

## 2016-09-27 ENCOUNTER — Encounter: Payer: Self-pay | Admitting: Adult Health

## 2016-10-03 ENCOUNTER — Ambulatory Visit (INDEPENDENT_AMBULATORY_CARE_PROVIDER_SITE_OTHER): Payer: BLUE CROSS/BLUE SHIELD | Admitting: Diagnostic Neuroimaging

## 2016-10-03 ENCOUNTER — Encounter: Payer: Self-pay | Admitting: Diagnostic Neuroimaging

## 2016-10-03 ENCOUNTER — Encounter (INDEPENDENT_AMBULATORY_CARE_PROVIDER_SITE_OTHER): Payer: Self-pay

## 2016-10-03 VITALS — BP 135/82 | HR 71 | Wt 280.0 lb

## 2016-10-03 DIAGNOSIS — R55 Syncope and collapse: Secondary | ICD-10-CM | POA: Diagnosis not present

## 2016-10-03 DIAGNOSIS — R569 Unspecified convulsions: Secondary | ICD-10-CM | POA: Diagnosis not present

## 2016-10-03 MED ORDER — LEVETIRACETAM 500 MG PO TABS
500.0000 mg | ORAL_TABLET | Freq: Two times a day (BID) | ORAL | 12 refills | Status: DC
Start: 2016-10-03 — End: 2017-02-08

## 2016-10-03 NOTE — Progress Notes (Signed)
[facility]  PATIENT: Brent Austin DOB: 1990/07/13  REFERRING CLINICIAN: Henrietta Hoover HISTORY FROM: patient  REASON FOR VISIT: follow up   HISTORICAL  CHIEF COMPLAINT:  Chief Complaint  Patient presents with  . Seizures    rm 7, "lost consciousness late March or early April, back to normal after a min or two"   . Follow-up    3 month    HISTORY OF PRESENT ILLNESS:   UPDATE 10/03/16: Since last visit, has had more syncope attacks in Dec 2017 and March 2018. In August 15, 2016, attack was milder. Not able to tolerate TPX.   UPDATE 06/28/16 (MM): "26 year old male with a history of syncope and loss of consciousness. He returns today for follow-up. He was placed on Topamax for potential seizures. The patient states that he has done well on this medication. He states that there was a period that he was having a hard time tolerating Topamax and therefore stopped the medication. He states that when he was off the medication he had another syncopal event. He was taken to [facility]. He states that the event was witnessed by his girlfriend. She found him face first on the ground in their home. Fiance reported that he was not breathing. CPR initiated. There was no witness convulsing, no loss of bladder or bowel and he did not bite his tongue. At the hospital the patient underwent several tests all which was unremarkable. He was placed back on Topamax. He states that since then he's not had any additional syncopal events. He is tolerating the Topamax well. He does not have a primary care provider and for that reason has not followed up as instructed at the last visit. He returns today for an evaluation."  UPDATE 05/09/16: Went to ER on 05/09/16 for syncope attack at work. Admitted for heart workup (exercise tolerance test) and then dc home.   PRIOR HPI (04/22/17): 26 year old right-handed male here for evaluation of blackout spell. July 2012 patient had a blackout episode  which was possibly provoked by being overheated and dehydrated. Apparently he had some shaking seizure-like activity. He is evaluated by neurologist who performed MRI and EEG which were unremarkable. Patient was diagnosed with possible new onset syncope versus seizure and monitored. Patient did well until 04/17/16 when he was at home, getting out of the shower and then felt a headache. He took 2 ibuprofen and then sat down for dinner. Apparently he was moving his food around but does not recall doing this. His girlfriend went to the other room and when she came back found him passed out on the floor. He had some blood coming from his mouth. No definite convulsions. When he woke up he was crying and in a panic. Girlfriend called EMS and while waiting for them to arrive patient blacked out a second time. Patient's girlfriend performed CPR for 2 or 3 chest compressions and then patient woke back up. He then regained memory paramedics arrived. He is feeling better and opted not to go to the hospital at that time. He went to bed and the next day woke up feeling dizzy. Therefore he went to the emergency room for evaluation. CT scan and lab testing were performed the patient was discharged after 3-4 hours. Since that time patient has felt fine. No other symptoms. No family history of seizure. Patient has history of concussion while playing football when he was younger. Patient has history of headaches and migraines for some years. He describes 2  kinds of headaches. First type is in every other day mild or headache which is not that bothersome. He also has a severe left-sided headache which happens every 2-3 months associated with photophobia, phonophobia and nausea. Patient works for an [employer], in a Therap[employer], occupational, lifting boxes weighing 60 pounds, and driving to various locations around [place].   REVIEW OF SYSTEMS: Full 14 system review of systems performed and  negative with exception of: memory loss dizziness headache passing out speech diff.    ALLERGIES: No Known Allergies  HOME MEDICATIONS: Outpatient Medications Prior to Visit  Medication Sig Dispense Refill  . topiramate (TOPAMAX) 50 MG tablet Take 1-2 tablets (50-100 mg total) by mouth 2 (two) times daily. (Patient not taking: Reported on 10/03/2016) 120 tablet 12   No facility-administered medications prior to visit.     PAST MEDICAL HISTORY: Past Medical History:  Diagnosis Date  . Concussion 04/2009   football, [place]  . Migraine   . Passed out 04/17/2016   blacked out 12/2010, no diagnosis found  . Seizures (HCC)     PAST SURGICAL HISTORY: Past Surgical History:  Procedure Laterality Date  . NO PAST SURGERIES      FAMILY HISTORY: No family history on file.  SOCIAL HISTORY:  Social History   Social History  . Marital status: Single    Spouse name: N/A  . Number of children: 1  . Years of education: 40   Occupational History  .      [employer]- distributor- insulation   Social History Main Topics  . Smoking status: Never Smoker  . Smokeless tobacco: Never Used  . Alcohol use Yes     Comment: 04/22/16 once a week  . Drug use: No  . Sexual activity: Not on file   Other Topics Concern  . Not on file   Social History Narrative   Lives with girl friend   Caffeine- none     PHYSICAL EXAM  GENERAL EXAM/CONSTITUTIONAL: Vitals:  Vitals:   10/03/16 1525  BP: 135/82  Pulse: 71  Weight: 280 lb (127 kg)   Body mass index is 35.95 kg/m. No exam data present  Patient is in no distress; well developed, nourished and groomed; neck is supple  CARDIOVASCULAR:  Examination of carotid arteries is normal; no carotid bruits  Regular rate and rhythm, no murmurs  Examination of peripheral vascular system by observation and palpation is normal  EYES:  Ophthalmoscopic exam of optic discs and posterior segments is normal; no papilledema or  hemorrhages  MUSCULOSKELETAL:  Gait, strength, tone, movements noted in Neurologic exam below  NEUROLOGIC: MENTAL STATUS:  No flowsheet data found.  awake, alert, oriented to person, place and time  recent and remote memory intact  normal attention and concentration  language fluent, comprehension intact, naming intact,   fund of knowledge appropriate  CRANIAL NERVE:   2nd - no papilledema on fundoscopic exam  2nd, 3rd, 4th, 6th - pupils equal and reactive to light, visual fields full to confrontation, extraocular muscles intact, no nystagmus  5th - facial sensation symmetric  7th - facial strength symmetric  8th - hearing intact  9th - palate elevates symmetrically, uvula midline  11th - shoulder shrug symmetric  12th - tongue protrusion midline  MOTOR:   normal bulk and tone, full strength in the BUE, BLE  SENSORY:   normal and symmetric to light touch, temperature, vibration  COORDINATION:   finger-nose-finger, fine finger movements normal  REFLEXES:  deep tendon reflexes present and symmetric  GAIT/STATION:   narrow based gait    DIAGNOSTIC DATA (LABS, IMAGING, TESTING) - I reviewed patient records, labs, notes, testing and imaging myself where available.  Lab Results  Component Value Date   WBC 12.9 (H) 05/10/2016   HGB 16.1 05/10/2016   HCT 45.5 05/10/2016   MCV 87.3 05/10/2016   PLT 264 05/10/2016      Component Value Date/Time   NA 135 05/10/2016 0403   K 4.2 05/10/2016 0403   CL 102 05/10/2016 0403   CO2 23 05/10/2016 0403   GLUCOSE 153 (H) 05/10/2016 0403   BUN 14 05/10/2016 0403   CREATININE 1.16 05/10/2016 0403   CALCIUM 9.9 05/10/2016 0403   PROT 6.9 05/09/2016 0940   ALBUMIN 4.3 05/09/2016 0940   AST 44 (H) 05/09/2016 0940   ALT 55 05/09/2016 0940   ALKPHOS 64 05/09/2016 0940   BILITOT 0.9 05/09/2016 0940   GFRNONAA >60 05/10/2016 0403   GFRAA >60 05/10/2016 0403   Lab Results  Component Value Date   CHOL 182  05/10/2016   HDL 38 (L) 05/10/2016   LDLCALC 111 (H) 05/10/2016   TRIG 163 (H) 05/10/2016   CHOLHDL 4.8 05/10/2016   Lab Results  Component Value Date   HGBA1C 5.1 05/10/2016   No results found for: VITAMINB12 Lab Results  Component Value Date   TSH 0.416 05/10/2016     04/18/16 CT head [I reviewed images myself and agree with interpretation. -VRP]  - negative  04/25/16 EEG - normal  05/10/16 EEG - normal  05/09/16 MRI brain [I reviewed images myself and agree with interpretation. -VRP]  - Normal brain MRI.  05/10/16 Exercise tolerance test - There was no ST segment deviation noted during stress. - No T wave inversion was noted during stress.      ASSESSMENT AND PLAN  26 y.o. year old male here with 5 episodes of syncope, with features suspicious for seizure.   Last event ~ August 15, 2016.   Tried and failed: topiramate (nausea, decr appetite)   Ddx: seizure vs syncope  1. Syncope and collapse   2. Seizures (HCC)      PLAN:  - start trial of levetiracetam  twice a day for seizure prevention  - follow up with cardiology clinic for recurrent syncope  - According to [place] law, you can not drive unless you are seizure free for at least 6 months and under physician's care.   - Please maintain seizure precautions. Do not participate in activities where a loss of awareness could harm you or someone else. No swimming alone, no tub bathing, no hot tubs, no driving, no operating motorized vehicles (cars, ATVs, motocycles, etc), lawnmowers or power tools. No standing at heights, such as rooftops, ladders or stairs. Avoid hot objects such as stoves, heaters, open fires. Wear a helmet when riding a bicycle, scooter, skateboard, etc. and avoid areas of traffic. Set your water heater to 120 degrees or less.  Meds ordered this encounter  Medications  . levETIRAcetam (KEPPRA) 500 MG tablet    Sig: Take 1 tablet (500 mg total) by mouth 2 (two) times daily.    Dispense:   60 tablet    Refill:  12   Return in about 3 months (around 01/02/2017).    Suanne Marker, MD 10/03/2016, 3:36 PM Certified in Neurology, Neurophysiology and Neuroimaging  [facility] [address] [place], [place] [address] [phone]

## 2016-10-03 NOTE — Patient Instructions (Signed)
-   start trial of levetiracetam  twice a day for seizure prevention  - follow up with cardiology clinic for recurrent syncope  - According to [place] law, you can not drive unless you are seizure free for at least 6 months and under physician's care.   - Please maintain seizure precautions. Do not participate in activities where a loss of awareness could harm you or someone else. No swimming alone, no tub bathing, no hot tubs, no driving, no operating motorized vehicles (cars, ATVs, motocycles, etc), lawnmowers or power tools. No standing at heights, such as rooftops, ladders or stairs. Avoid hot objects such as stoves, heaters, open fires. Wear a helmet when riding a bicycle, scooter, skateboard, etc. and avoid areas of traffic. Set your water heater to 120 degrees or less.

## 2016-10-20 ENCOUNTER — Telehealth: Payer: Self-pay | Admitting: Diagnostic Neuroimaging

## 2016-10-21 NOTE — Telephone Encounter (Signed)
error 

## 2016-11-03 ENCOUNTER — Telehealth: Payer: Self-pay | Admitting: Diagnostic Neuroimaging

## 2016-11-03 NOTE — Telephone Encounter (Signed)
LVM advising [name] [name] were received, and they are on Dr Visteon CorporationPenumalli's desk for review. Advised that Dr Marjory LiesPenumalli needs to determine if he should complete them or refer [name]. Left office #, advised the office is now closed.

## 2016-11-03 NOTE — Telephone Encounter (Signed)
Chetan from [employer] called for Dortha Schwalbeick a [employer] Nurse Case manager wanting to know if a fax was received from yesterday re: disability for pt. He has asked that [name] receive a call back at [phone] xt [phone] re: incident  Claim #[id number]. He is asking for a call back to confirm a fax has been received

## 2016-11-04 NOTE — Telephone Encounter (Signed)
[employer] papers completed, signed by Dr Marjory LiesPenumalli. Sent to MR for processing. LVM for Harriette Bouillonick, [employer] nurse case manager advising him papers have been sent to MR for processing. Left office number, advised office is now closed, reopens Monday morning.

## 2016-12-13 ENCOUNTER — Telehealth: Payer: Self-pay | Admitting: Diagnostic Neuroimaging

## 2016-12-13 NOTE — Telephone Encounter (Signed)
Spoke to [place] with [employer].  Confirmed that first time we saw pt was 04-22-2016.  She verbalized understanding.

## 2016-12-13 NOTE — Telephone Encounter (Signed)
Patient called office in reference to our office needing to contact his case worker with his disability case.  She is needing to verify that patient was not seen 3 months prior to June 2017 when patient started medical insurance.  Please contact Rosey Batheresa [phone]

## 2016-12-22 IMAGING — CT CT HEAD W/O CM
3 series · 14 of 47 positions shown, 16 images · non-contrast
Comparison: 04/08/2016

CLINICAL DATA: Patient found down at work today.

EXAM:
CT HEAD WITHOUT CONTRAST
TECHNIQUE: Contiguous axial images were obtained from the base of the skull
through the vertex without intravenous contrast.

[Series 2: head 5.0 h30s · axial · 0.51mm/px · z∈[-43,+97]mm · 8 of 34 slices shown, 10 images]
[im 3/34  brain]
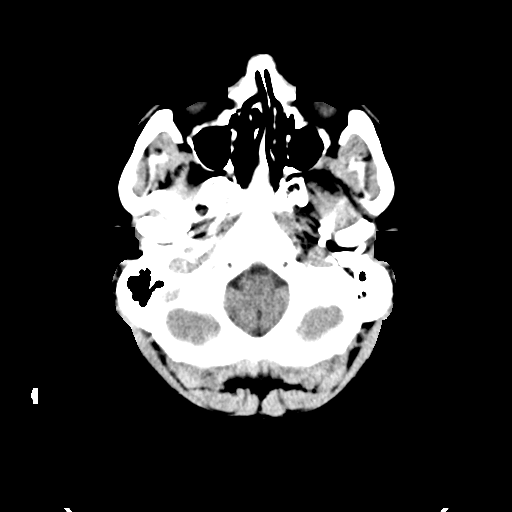
[im 3/34  bone]
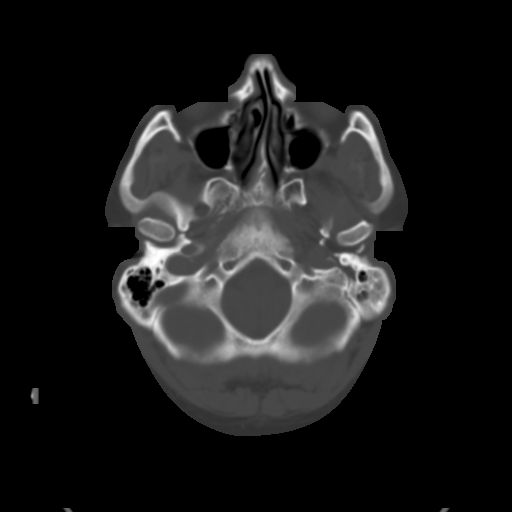
[im 7/34  brain]
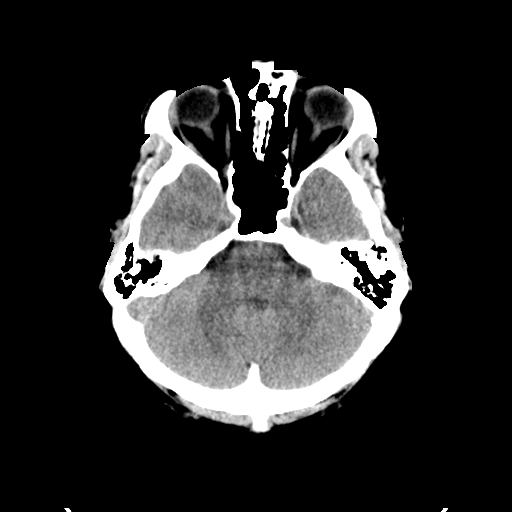
[im 11/34  brain]
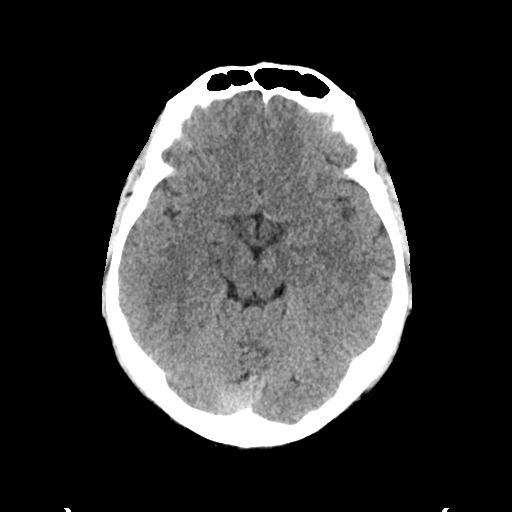
[im 15/34  brain]
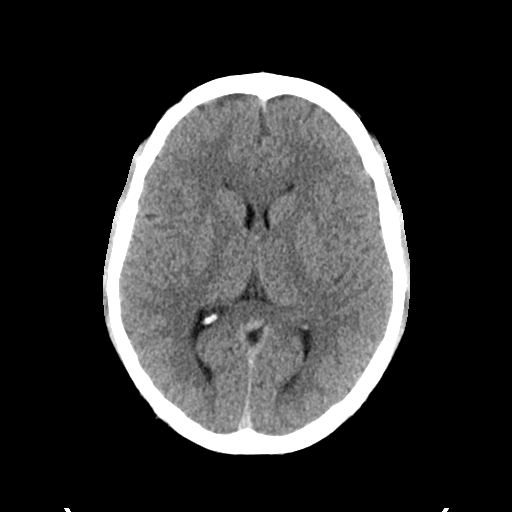
[im 19/34  brain]
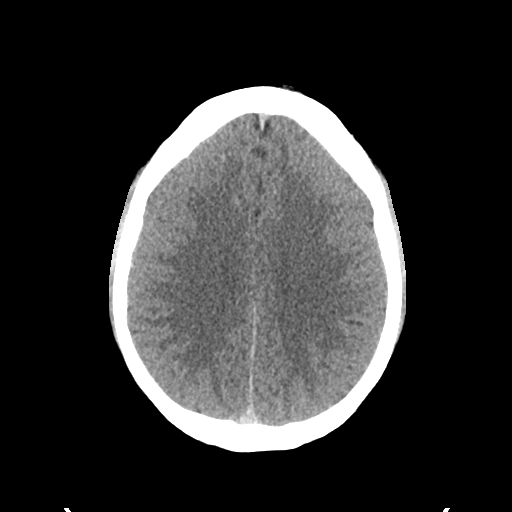
[im 19/34  bone]
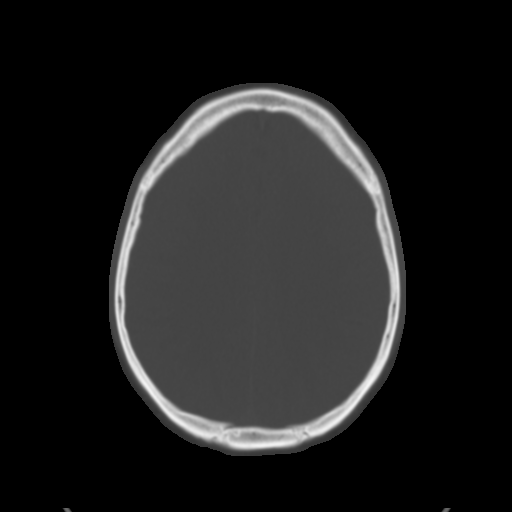
[im 23/34  brain]
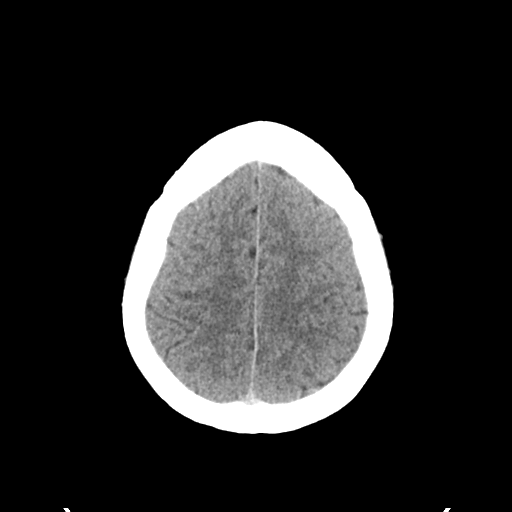
[im 27/34  brain]
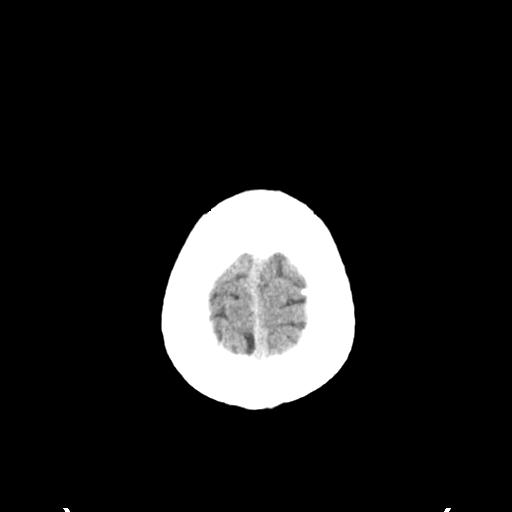
[im 31/34  brain]
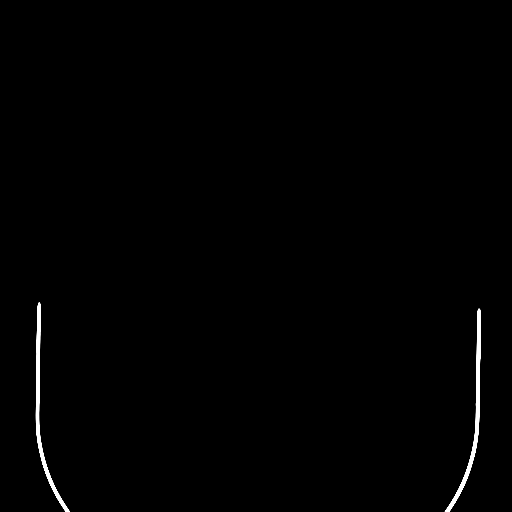

[Series 4: head 3.0 mpr cor · coronal · 0.35mm/px · 3 of 67 slices shown]
[im 23/67  brain]
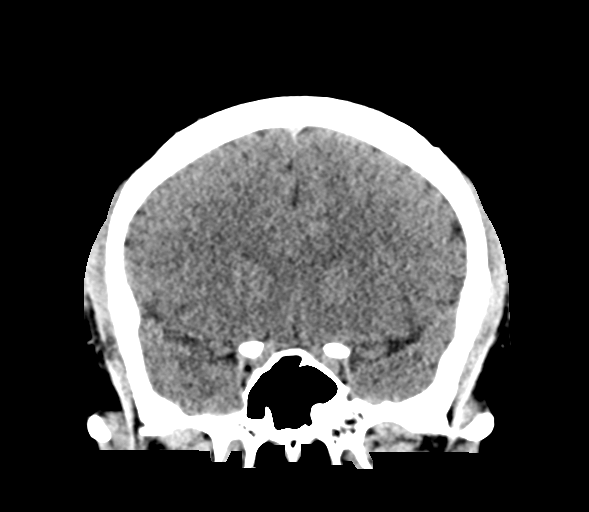
[im 30/67  brain]
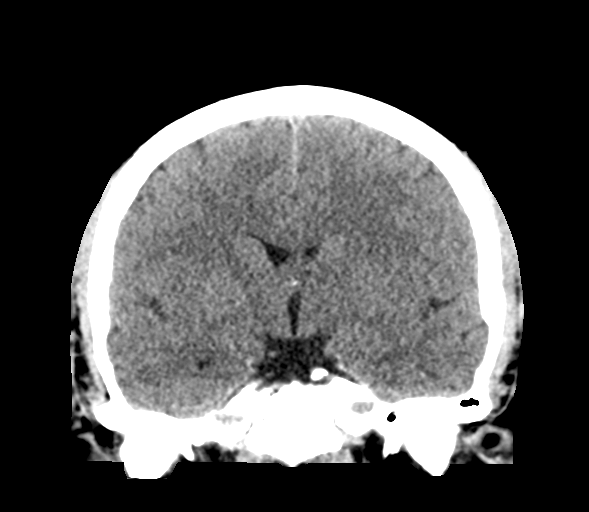
[im 37/67  brain]
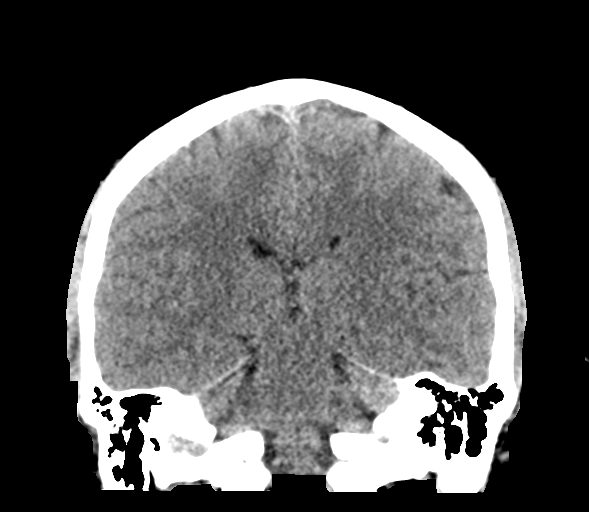

[Series 5: head 3.0 mpr sag · sagittal · 0.32mm/px · 3 of 67 slices shown]
[im 23/67  brain]
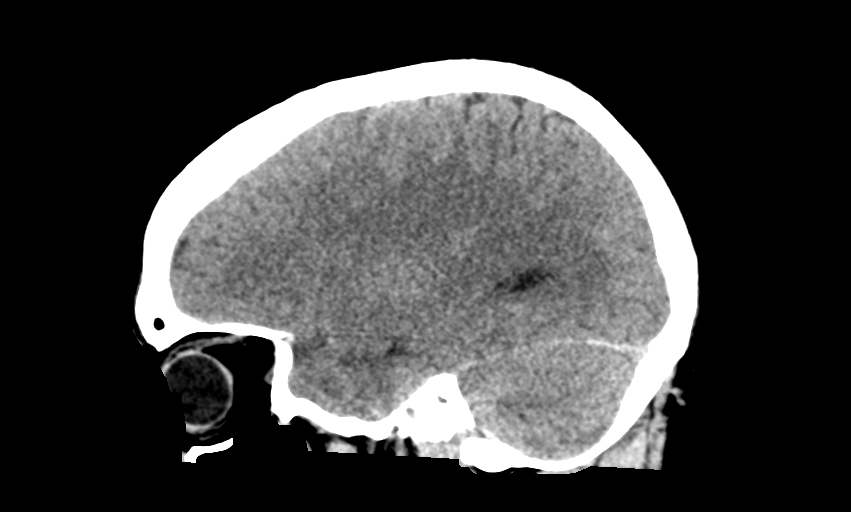
[im 34/67  brain]
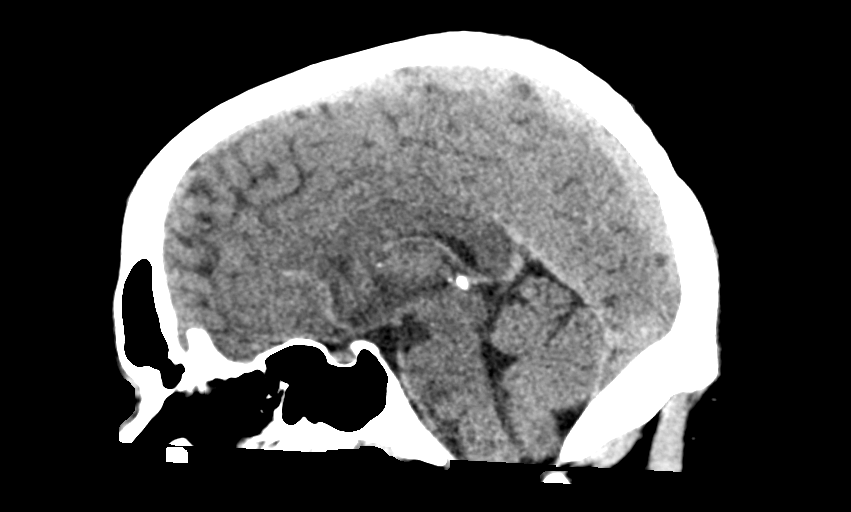
[im 45/67  brain]
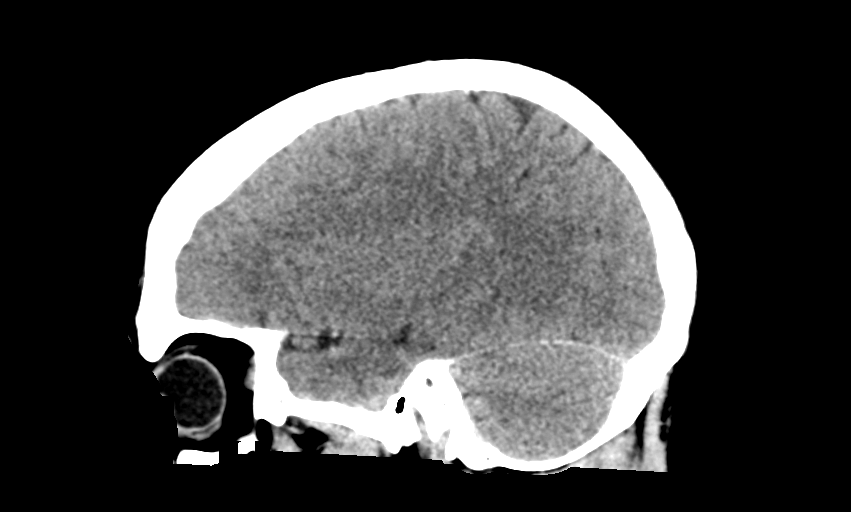

[14 of 47 positions shown; findings below may reference images not displayed]

FINDINGS: Brain: There is no evidence for acute hemorrhage, hydrocephalus,
mass lesion, or abnormal extra-axial fluid collection. No definite
CT evidence for acute infarction.

Vascular: No hyperdense vessel or unexpected calcification.

Skull: No evidence for fracture. No worrisome lytic or sclerotic
lesion.

Sinuses/Orbits: The visualized paranasal sinuses and mastoid air
cells are clear. Visualized portions of the globes and intraorbital
fat are unremarkable.

Other: None.
IMPRESSION: Normal CT scan of the brain.

## 2017-02-07 ENCOUNTER — Telehealth: Payer: Self-pay | Admitting: Diagnostic Neuroimaging

## 2017-02-07 NOTE — Telephone Encounter (Signed)
Spoke to pt and relayed options to him.  He stated would like to come in for f/u.  Last seen 06-28-16.  Made appt for tomorrow at 1430.

## 2017-02-07 NOTE — Telephone Encounter (Signed)
I recommend to use levetiracetam.   He may come here for follow up.   Or he may see [facility] for second opinion.   -VRP

## 2017-02-07 NOTE — Telephone Encounter (Signed)
I spoke to pt.  He stated had sz yesterday (witnessed by grandparents).  He napped on couch, got up to go to bathroom (feeling nauseated, urinated) came back to sit at kitchen table, had headache, nausea, then fell out, (seizure for 10min) called EMT, did not go to hospital.  Pt is taking topamax 100mg  po bid. (could not afford the keppra).  He stated he let the disability people know and thought they relayed to us.  I told him this information was not given to us.   He has not had any missed doses.  He is not sure what triggered this.  [place], Dr. Sol Passerough, saw pcp, and he stated was [facility] ([facility]urology and cardiology).  He states was going to walk in.  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 He does not have an appointment.   I would relay to Dr. Marjory LiesPenumalli and would contact him back for any changes.

## 2017-02-07 NOTE — Telephone Encounter (Signed)
Pt called the clinic said he had another seizure and syncope yesterday. EMS was called but he did not go to ED after being checked out. He said [facility] be [facility] for the symptoms. He was referred by a provider in [place]. He was calling to let Dr Marjory LiesPenumalli know since he is to go back to work next month. Please call, he does not have VM and is taking care of his infant son, please try back if he does not answer.

## 2017-02-08 ENCOUNTER — Ambulatory Visit (INDEPENDENT_AMBULATORY_CARE_PROVIDER_SITE_OTHER): Payer: BLUE CROSS/BLUE SHIELD | Admitting: Diagnostic Neuroimaging

## 2017-02-08 ENCOUNTER — Encounter: Payer: Self-pay | Admitting: Diagnostic Neuroimaging

## 2017-02-08 VITALS — BP 135/87 | HR 74 | Ht 74.0 in | Wt 278.2 lb

## 2017-02-08 DIAGNOSIS — R55 Syncope and collapse: Secondary | ICD-10-CM

## 2017-02-08 DIAGNOSIS — R569 Unspecified convulsions: Secondary | ICD-10-CM

## 2017-02-08 MED ORDER — LEVETIRACETAM 500 MG PO TABS: 500.0000 mg | ORAL_TABLET | Freq: Two times a day (BID) | ORAL | 12 refills | Status: DC

## 2017-02-08 MED ORDER — TOPIRAMATE 50 MG PO TABS: 50.0000 mg | ORAL_TABLET | Freq: Two times a day (BID) | ORAL | 12 refills | Status: DC

## 2017-02-08 NOTE — Patient Instructions (Signed)
-   start levetiracetam 500mg  twice a day for seizure prevention   - continue topiramate 50mg  twice a day for migraine prevention  - follow up with cardiology clinic for recurrent syncope  - According to [place] law, you can not drive unless you are seizure free for at least 6 months and under physician's care.   - Please maintain seizure precautions. Do not participate in activities where a loss of awareness could harm you or someone else. No swimming alone, no tub bathing, no hot tubs, no driving, no operating motorized vehicles (cars, ATVs, motocycles, etc), lawnmowers or power tools. No standing at heights, such as rooftops, ladders or stairs. Avoid hot objects such as stoves, heaters, open fires. Wear a helmet when riding a bicycle, scooter, skateboard, etc. and avoid areas of traffic. Set your water heater to 120 degrees or less.
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
## 2017-05-22 ENCOUNTER — Telehealth: Payer: Self-pay | Admitting: *Deleted

## 2017-05-22 NOTE — Telephone Encounter (Signed)
Pt [employer] form on [employer] [place] [employer] desk.

## 2017-05-23 NOTE — Telephone Encounter (Signed)
Called patient to discuss [employer] disability forms. 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 He stated his company has put him out of work on disability because there is nothing else he can do in the company with his restrictions. He is taking levetiracetam as prescribed, buying form [facility], and it is affordable. He still has headaches 2-3 x week, is dizzy off and on, but denies any blackout spells since Sept 3,2018. He is looking for another job. This RN advised will complete papers, then he will get call to pay fee before [facility] Patient verbalized understanding, appreciation. Cign[employer] forms on Dr Visteon CorporationPenumalli's desk for review, signature.

## 2017-05-24 NOTE — Telephone Encounter (Signed)
[employer] forms completed, signed by Dr Marjory LiesPenumalli. Sent to medical records for processing.

## 2017-05-25 ENCOUNTER — Telehealth: Payer: Self-pay | Admitting: *Deleted

## 2017-05-25 NOTE — Telephone Encounter (Signed)
error 

## 2017-05-26 NOTE — Telephone Encounter (Signed)
Pt [employer] form faxed on 05/26/17.

## 2017-07-24 ENCOUNTER — Telehealth: Payer: Self-pay | Admitting: Diagnostic Neuroimaging
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
## 2017-07-24 NOTE — Telephone Encounter (Signed)
Pt states that in the last 5 days he has had 3 nose bleeds.  Pt is feeling dizzy and light headed and has also vomited, he is asking to be called

## 2017-07-25 NOTE — Telephone Encounter (Signed)
Spoke with patient and informed him that Angie in billing stated that his insurance does require a co pay, does not cover 100%. Advised if he makes a payment towards his bill, sets up payment we can schedule a follow up. Otherwise, advised him Dr Marjory LiesPenumalli recommends he see his PCP or go to Urgent Care if he is having problems after hours. Patient stated he gets paid next week an requested Angie, billing call him next Monday to make a payment.  He stated he had a migraine last night but denied any further nose bleeds. He verbalized understanding of call.  This phone note  routed to Angie.

## 2017-08-21 NOTE — Telephone Encounter (Signed)
Pt called stating he isnt sure what to do, he is needing to schedule a f/u to know when he can go back to work but isnt able to pay outstanding bills till he gets his tax money back. Pt requesting a call back to understand what to do moving forward.

## 2017-10-12 DIAGNOSIS — Z0271 Encounter for disability determination: Secondary | ICD-10-CM

## 2017-11-06 DIAGNOSIS — Z0271 Encounter for disability determination: Secondary | ICD-10-CM

## 2018-04-02 ENCOUNTER — Encounter: Payer: Self-pay | Admitting: Diagnostic Neuroimaging

## 2018-04-02 ENCOUNTER — Encounter

## 2018-04-02 ENCOUNTER — Ambulatory Visit (INDEPENDENT_AMBULATORY_CARE_PROVIDER_SITE_OTHER): Payer: Self-pay | Admitting: Diagnostic Neuroimaging

## 2018-04-02 VITALS — BP 152/87 | HR 77 | Ht 74.0 in | Wt 280.0 lb

## 2018-04-02 DIAGNOSIS — G43109 Migraine with aura, not intractable, without status migrainosus: Secondary | ICD-10-CM

## 2018-04-02 DIAGNOSIS — R569 Unspecified convulsions: Secondary | ICD-10-CM

## 2018-04-02 MED ORDER — TOPIRAMATE 50 MG PO TABS: 50.0000 mg | ORAL_TABLET | Freq: Two times a day (BID) | ORAL | 4 refills | Status: AC

## 2018-04-02 MED ORDER — LEVETIRACETAM 500 MG PO TABS
500.0000 mg | ORAL_TABLET | Freq: Two times a day (BID) | ORAL | 4 refills | Status: AC
Start: 2018-04-02 — End: ?

## 2018-04-02 NOTE — Patient Instructions (Signed)
-  continue current medications

## 2018-04-02 NOTE — Progress Notes (Signed)
[facility]  PATIENT: Brent Austin DOB: [birth year]  REFERRING CLINICIAN: Henrietta Austin HISTORY FROM: patient  REASON FOR VISIT: follow up   HISTORICAL  CHIEF COMPLAINT:  Chief Complaint  Patient presents with  . Seizures    rm 7, "no seizure activity"  . Follow-up    one year    HISTORY OF PRESENT ILLNESS:   UPDATE (04/02/18, VRP): Since last visit, doing well. Symptoms are improved. No seizures. No headaches. No alleviating or aggravating factors. Tolerating meds.  Back to driving.  UPDATE 02/08/17: Since last visit, could not afford LEV 500mg  BID ($85 per month at [facility]) so he went back to topiramate. Was doing well until, he had another seizure on 02/06/17 at home. No warning. Collapsed and had witnessed convulsions (5-10 min), foam at mouth, no tongue biting. No incontinence. Woke up with HA, dizzy, confused. EMS called, and saw patient. Declined to go to hospital. Having HA 1-2 per week.  UPDATE 10/03/16: Since last visit, has had more syncope attacks in Dec 2017 and March 2018. In August 15, 2016, attack was milder. Not able to tolerate TPX.   UPDATE 06/28/16 (MM): "27 year old male with a history of syncope and loss of consciousness. He returns today for follow-up. He was placed on Topamax for potential seizures. The patient states that he has done well on this medication. He states that there was a period that he was having a hard time tolerating Topamax and therefore stopped the medication. He states that when he was off the medication he had another syncopal event. He was taken to [facility]. He states that the event was witnessed by his girlfriend. She found him face first on the ground in their home. Fiance reported that he was not breathing. CPR initiated. There was no witness convulsing, no loss of bladder or bowel and he did not bite his tongue. At the hospital the patient underwent several tests all which was unremarkable. He was placed  back on Topamax. He states that since then he's not had any additional syncopal events. He is tolerating the Topamax well. He does not have a primary care provider and for that reason has not followed up as instructed at the last visit. He returns today for an evaluation."  UPDATE 05/09/16: Went to ER on 05/09/16 for syncope attack at work. Admitted for heart workup (exercise tolerance test) and then dc home.   PRIOR HPI (04/22/16): 27 year old right-handed male here for evaluation of blackout spell. July 2012 patient had a blackout episode which was possibly provoked by being overheated and dehydrated. Apparently he had some shaking seizure-like activity. He is evaluated by neurologist who performed MRI and EEG which were unremarkable. Patient was diagnosed with possible new onset syncope versus seizure and monitored. Patient did well until 04/17/16 when he was at home, getting out of the shower and then felt a headache. He took 2 ibuprofen and then sat down for dinner. Apparently he was moving his food around but does not recall doing this. His girlfriend went to the other room and when she came back found him passed out on the floor. He had some blood coming from his mouth. No definite convulsions. When he woke up he was crying and in a panic. Girlfriend called EMS and while waiting for them to arrive patient blacked out a second time. Patient's girlfriend performed CPR for 2 or 3 chest compressions and then patient woke back up. He then regained memory paramedics arrived. He  is feeling better and opted not to go to the hospital at that time. He went to bed and the next day woke up feeling dizzy. Therefore he went to the emergency room for evaluation. CT scan and lab testing were performed the patient was discharged after 3-4 hours. Since that time patient has felt fine. No other symptoms. No family history of seizure. Patient has history of concussion while playing football when he was younger. Patient has  history of headaches and migraines for some years. He describes 2 kinds of headaches. First type is in every other day mild or headache which is not that bothersome. He also has a severe left-sided headache which happens every 2-3 months associated with photophobia, phonophobia and nausea. 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   REVIEW OF SYSTEMS: Full 14 system review of systems performed and negative with exception of: negative.    ALLERGIES: No Known Allergies  HOME MEDICATIONS: Outpatient Medications Prior to Visit  Medication Sig Dispense Refill  . levETIRAcetam (KEPPRA) 500 MG tablet Take 1 tablet (500 mg total) by mouth 2 (two) times daily. 60 tablet 12  . topiramate (TOPAMAX) 50 MG tablet Take 1 tablet (50 mg total) by mouth 2 (two) times daily. 60 tablet 12   No facility-administered medications prior to visit.     PAST MEDICAL HISTORY: Past Medical History:  Diagnosis Date  . Concussion 04/2009   football, LOC  . Migraine   . Passed out 04/17/2016   blacked out 12/2010, no diagnosis found  . Seizures (HCC)     PAST SURGICAL HISTORY: Past Surgical History:  Procedure Laterality Date  . NO PAST SURGERIES      FAMILY HISTORY: No family history on file.  SOCIAL HISTORY:  Social History   Socioeconomic History  . Marital status: Single    Spouse name: Not on file  . Number of children: 1  . Years of education: 40  . 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 Financial resource strain: Not on file  . Food insecurity:    Worry: Not on file    Inability: Not on file  . Transportation needs:    Medical: Not on file    Non-medical: Not on file  Tobacco Use  . Smoking status: Never Smoker  . Smokeless tobacco: Never Used  Substance  and Sexual Activity  . Alcohol use: Yes    Comment: 04/22/16 once a week  . Drug use: No  . Sexual activity: Not on file  Lifestyle  . Physical activity:    Days per week: Not on file    Minutes per session: Not on file  . Stress: Not on file  Relationships  . Social connections:    Talks on phone: Not on file    Gets together: Not on file    Attends religious service: Not on file    Active member of club or organization: Not on file    Attends meetings of clubs or organizations: Not on file    Relationship status: Not on file  . Intimate partner violence:    Fear of current or ex partner: Not on file    Emotionally abused: Not on file    Physically abused: Not on file    Forced sexual activity: Not on file  Other  Topics Concern  . Not on file  Social History Narrative   Lives with girl friend   Caffeine- none     PHYSICAL EXAM  GENERAL EXAM/CONSTITUTIONAL: Vitals:  Vitals:   04/02/18 1359  BP: (!) 152/87  Pulse: 77  Weight: 280 lb (127 kg)  Height: 6\' 2"  (1.88 m)   Body mass index is 35.95 kg/m. No exam data present  Patient is in no distress; well developed, nourished and groomed; neck is supple  CARDIOVASCULAR:  Examination of carotid arteries is normal; no carotid bruits  Regular rate and rhythm, no murmurs  Examination of peripheral vascular system by observation and palpation is normal  EYES:  Ophthalmoscopic exam of optic discs and posterior segments is normal; no papilledema or hemorrhages  MUSCULOSKELETAL:  Gait, strength, tone, movements noted in Neurologic exam below  NEUROLOGIC: MENTAL STATUS:  No flowsheet data found.  awake, alert, oriented to person, place and time  recent and remote memory intact  normal attention and concentration  language fluent, comprehension intact, naming intact,   fund of knowledge appropriate  CRANIAL NERVE:   2nd - no papilledema on fundoscopic exam  2nd, 3rd, 4th, 6th - pupils equal and  reactive to light, visual fields full to confrontation, extraocular muscles intact, no nystagmus  5th - facial sensation symmetric  7th - facial strength symmetric  8th - hearing intact  9th - palate elevates symmetrically, uvula midline  11th - shoulder shrug symmetric  12th - tongue protrusion midline  MOTOR:   normal bulk and tone, full strength in the BUE, BLE  SENSORY:   normal and symmetric to light touch, temperature, vibration  COORDINATION:   finger-nose-finger, fine finger movements normal  REFLEXES:   deep tendon reflexes present and symmetric  GAIT/STATION:   narrow based gait    DIAGNOSTIC DATA (LABS, IMAGING, TESTING) - I reviewed patient records, labs, notes, testing and imaging myself where available.  Lab Results  Component Value Date   WBC 12.9 (H) 05/10/2016   HGB 16.1 05/10/2016   HCT 45.5 05/10/2016   MCV 87.3 05/10/2016   PLT 264 05/10/2016      Component Value Date/Time   NA 135 05/10/2016 0403   K 4.2 05/10/2016 0403   CL 102 05/10/2016 0403   CO2 23 05/10/2016 0403   GLUCOSE 153 (H) 05/10/2016 0403   BUN 14 05/10/2016 0403   CREATININE 1.16 05/10/2016 0403   CALCIUM 9.9 05/10/2016 0403   PROT 6.9 05/09/2016 0940   ALBUMIN 4.3 05/09/2016 0940   AST 44 (H) 05/09/2016 0940   ALT 55 05/09/2016 0940   ALKPHOS 64 05/09/2016 0940   BILITOT 0.9 05/09/2016 0940   GFRNONAA >60 05/10/2016 0403   GFRAA >60 05/10/2016 0403   Lab Results  Component Value Date   CHOL 182 05/10/2016   HDL 38 (L) 05/10/2016   LDLCALC 111 (H) 05/10/2016   TRIG 163 (H) 05/10/2016   CHOLHDL 4.8 05/10/2016   Lab Results  Component Value Date   HGBA1C 5.1 05/10/2016   No results found for: VITAMINB12 Lab Results  Component Value Date   TSH 0.416 05/10/2016     04/18/16 CT head [I reviewed images myself and agree with interpretation. -VRP]  - negative  04/25/16 EEG - normal  05/10/16 EEG - normal  05/09/16 MRI brain [I reviewed images  myself and agree with interpretation. -VRP]  - Normal brain MRI.  05/10/16 Exercise tolerance test - There was no ST segment deviation noted during stress. - No  T wave inversion was noted during stress.      ASSESSMENT AND PLAN  27 y.o. year old male here with multiple episodes of syncope, with features suspicious for seizure (especially event on 02/06/17).  Last events: 08/15/16 and 02/06/17  meds tried: topiramate  Dx: seizure (+/- syncope) + migraine with aura  1. Seizures (HCC)   2. Migraine with aura and without status migrainosus, not intractable      PLAN:  SEIZURE DISORDER (stable) - continue levetiracetam 500mg  twice a day for seizure prevention - ok to continue driving  MIGRAINE WITH AURA (stable) - continue topiramate 50mg  twice a day for migraine prevention    Meds ordered this encounter  Medications  . topiramate (TOPAMAX) 50 MG tablet    Sig: Take 1 tablet (50 mg total) by mouth 2 (two) times daily.    Dispense:  180 tablet    Refill:  4  . levETIRAcetam (KEPPRA) 500 MG tablet    Sig: Take 1 tablet (500 mg total) by mouth 2 (two) times daily.    Dispense:  180 tablet    Refill:  4   Return in about 1 year (around 04/03/2019).    Suanne Marker, MD 04/02/2018, 2:06 PM Certified in Neurology, Neurophysiology and Neuroimaging  [facility] [address] [place], [place] [address] [phone]

## 2019-03-12 ENCOUNTER — Telehealth: Payer: Self-pay | Admitting: Diagnostic Neuroimaging

## 2019-03-12 ENCOUNTER — Encounter: Payer: Self-pay | Admitting: Diagnostic Neuroimaging

## 2019-03-12 DIAGNOSIS — R569 Unspecified convulsions: Secondary | ICD-10-CM

## 2019-03-12 NOTE — Telephone Encounter (Signed)
Pt called stating that he is needing to speak to provider or RN about the seizures that where mentioned in some medical records from this office. Please advise.

## 2019-03-12 NOTE — Telephone Encounter (Signed)
LVM requesting call back to discuss.

## 2019-03-12 NOTE — Telephone Encounter (Addendum)
Called patient who stated he is working for [employer], went to [employer] today for physical. He passed his physical.  He had to give them his medical records and due to mention of seizures in neurology notes he cannot work with [employer] until this is resolved. He stated that he has never had seizures or been told he was having convulsions. He stated he's had numerous tests including EEGs which have all been negative. He stated he was only put on medication as a precautionary measure. I reviewed his office note dated 02/08/2018, and he stated he never remembers being told he was foaming at the mouth or having convulsions; he just passed out.  I advised will discuss with Dr Leta Baptist and let him know. Patient verbalized understanding, appreciation.

## 2019-03-12 NOTE — Telephone Encounter (Signed)
Pt returning call please call back °

## 2019-03-12 NOTE — Telephone Encounter (Signed)
May offer second opinion at academic center if patient wants. -VRP

## 2019-03-13 NOTE — Telephone Encounter (Signed)
Patient returned call and stated all he needs is a letter from Dr Leta Baptist stating he doesn't have seizures. I advised him per Dr Leta Baptist, he will not reverse his diagnosis of seizures. The patient acknowledged and requested he be referred to [facility] for a second opinion. I advised him the referral will be placed. Patient verbalized understanding, appreciation. Referral placed.

## 2019-03-13 NOTE — Telephone Encounter (Signed)
LVM informing patient that Dr Leta Baptist will put in a referral to a medical academic center such as [place], [place] [place] or [facility] if he would like. Left number for call back.

## 2019-03-13 NOTE — Addendum Note (Signed)
Addended by: Minna Antis on: 03/13/2019 09:20 AM   Modules accepted: Orders

## 2019-04-10 ENCOUNTER — Ambulatory Visit: Payer: Self-pay | Admitting: Family Medicine

## 2019-04-10 ENCOUNTER — Encounter: Payer: Self-pay | Admitting: Family Medicine

## 2019-04-10 ENCOUNTER — Ambulatory Visit: Payer: Self-pay | Admitting: Adult Health

## 2019-04-10 NOTE — Progress Notes (Deleted)
PATIENT: Brent Austin DOB: [birth year]  REASON FOR VISIT: follow up HISTORY FROM: patient  No chief complaint on file.    HISTORY OF PRESENT ILLNESS: Today 04/10/19 Brent Austin is a 28 y.o. male here today for follow up for seizure.   HISTORY: (copied from Dr Richrd HumblesPenumalli's note on 04/02/2018)  UPDATE (04/02/18, VRP): Since last visit, doing well. Symptoms are improved. No seizures. No headaches. No alleviating or aggravating factors. Tolerating meds.  Back to driving.  UPDATE 02/08/17: Since last visit, could not afford LEV 500mg  BID ($[facility]) so he went back to topiramate. Was doing well until, he had another seizure on 02/06/17 at home. No warning. Collapsed and had witnessed convulsions (5-10 min), foam at mouth, no tongue biting. No incontinence. Woke up with HA, dizzy, confused. EMS called, and saw patient. Declined to go to hospital. Having HA 1-2 per week.  UPDATE 10/03/16: Since last visit, has had more syncope attacks in Dec 2017 and March 2018. In August 15, 2016, attack was milder. Not able to tolerate TPX.   UPDATE 06/28/16 (MM): "28 year old male with a history of syncope and loss of consciousness. He returns today for follow-up. He was placed on Topamax for potential seizures. The patient states that he has done well on this medication. He states that there was a period that he was having a hard time tolerating Topamax and therefore stopped the medication. He states that when he was off the medication he had another syncopal event. He was [facility]. He states that the event was witnessed by his girlfriend. She found him face first on the ground in their home. Fiance reported that he was not breathing. CPR initiated. There was no witness convulsing, no loss of bladder or bowel and he did not bite his tongue. At the hospital the patient underwent several tests all which was unremarkable. He was placed back on Topamax. He states that  since then he's not had any additional syncopal events. He is tolerating the Topamax well. He does not have a primary care provider andfor that reason has not followed up as instructed at the last visit. He returns today for an evaluation."  UPDATE 05/09/16: Went to ER on 05/09/16 for syncope attack at work. Admitted for heart workup (exercise tolerance test) and then dc home.   PRIOR HPI (04/22/16): 28 year old right-handed male here for evaluation of blackout spell. July 2012 patient had a blackout episode which was possibly provoked by being overheated and dehydrated. Apparently he had some shaking seizure-like activity. He is evaluated by neurologist who performed MRI and EEG which were unremarkable. Patient was diagnosed with possible new onset syncope versus seizure and monitored. Patient did well until 04/17/16 when he was at home, getting out of the shower and then felt a headache. He took 2 ibuprofen and then sat down for dinner. Apparently he was moving his food around but does not recall doing this. His girlfriend went to the other room and when she came back found him passed out on the floor. He had some blood coming from his mouth. No definite convulsions. When he woke up he was crying and in a panic. Girlfriend called EMS and while waiting for them to arrive patient blacked out a second time. Patient's girlfriend performed CPR for 2 or 3 chest compressions and then patient woke back up. He then regained memory paramedics arrived. He is feeling better and opted not to go to  the hospital at that time. He went to bed and the next day woke up feeling dizzy. Therefore he went to the emergency room for evaluation. CT scan and lab testing were performed the patient was discharged after 3-4 hours. Since that time patient has felt fine. No other symptoms. No family history of seizure. Patient has history of concussion while playing football when he was younger. Patient has history of headaches and  migraines for some years. He describes 2 kinds of headaches. First type is in every other day mild or headache which is not that bothersome. He also has a severe left-sided headache which happens every 2-3 months associated with photophobia, phonophobia and nausea. 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   REVIEW OF SYSTEMS: Out of a complete 14 system review of symptoms, the patient complains only of the following symptoms, and all other reviewed systems are negative.  ALLERGIES: No Known Allergies  HOME MEDICATIONS: Outpatient Medications Prior to Visit  Medication Sig Dispense Refill   levETIRAcetam (KEPPRA) 500 MG tablet Take 1 tablet (500 mg total) by mouth 2 (two) times daily. 180 tablet 4   topiramate (TOPAMAX) 50 MG tablet Take 1 tablet (50 mg total) by mouth 2 (two) times daily. 180 tablet 4   No facility-administered medications prior to visit.     PAST MEDICAL HISTORY: Past Medical History:  Diagnosis Date   Concussion 04/2009   football, LOC   Migraine    Passed out 04/17/2016   blacked out 12/2010, no diagnosis found   Seizures (HCC)     PAST SURGICAL HISTORY: Past Surgical History:  Procedure Laterality Date   NO PAST SURGERIES      FAMILY HISTORY: No family history on file.  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   Food insecurity    Worry: Not on file    Inability: Not on file   Transportation needs    Medical: Not on file    Non-medical: Not on file  Tobacco Use   Smoking status: Never Smoker   Smokeless tobacco: Never Used    Substance and Sexual Activity   Alcohol use: Yes    Comment: 04/22/16 once a week   Drug use: No   Sexual activity: Not on file  Lifestyle   Physical activity    Days per week: Not on file    Minutes per session: Not on file   Stress: Not on file  Relationships   Social connections    Talks on phone: Not on file    Gets together: Not on file    Attends religious service: Not on file    Active member of club or organization: Not on file    Attends meetings of clubs or organizations: Not on file    Relationship status: Not on file   Intimate partner violence    Fear of current or ex partner: Not on file    Emotionally abused: Not on file    Physically abused: Not on file    Forced sexual activity: Not on file  Other Topics Concern  Not on file  Social History Narrative   Lives with girl friend   Caffeine- none      PHYSICAL EXAM  There were no vitals filed for this visit. There is no height or weight on file to calculate BMI.  Generalized: Well developed, in no acute distress  Cardiology: normal rate and rhythm, no murmur noted Neurological examination  Mentation: Alert oriented to time, place, history taking. Follows all commands speech and language fluent Cranial nerve II-XII: Pupils were equal round reactive to light. Extraocular movements were full, visual field were full on confrontational test. Facial sensation and strength were normal. Uvula tongue midline. Head turning and shoulder shrug  were normal and symmetric. Motor: The motor testing reveals 5 over 5 strength of all 4 extremities. Good symmetric motor tone is noted throughout.  Sensory: Sensory testing is intact to soft touch on all 4 extremities. No evidence of extinction is noted.  Coordination: Cerebellar testing reveals good finger-nose-finger and heel-to-shin bilaterally.  Gait and station: Gait is normal. Tandem gait is normal. Romberg is negative. No drift is seen.  Reflexes: Deep tendon  reflexes are symmetric and normal bilaterally.   DIAGNOSTIC DATA (LABS, IMAGING, TESTING) - I reviewed patient records, labs, notes, testing and imaging myself where available.  No flowsheet data found.   Lab Results  Component Value Date   WBC 12.9 (H) 05/10/2016   HGB 16.1 05/10/2016   HCT 45.5 05/10/2016   MCV 87.3 05/10/2016   PLT 264 05/10/2016      Component Value Date/Time   NA 135 05/10/2016 0403   K 4.2 05/10/2016 0403   CL 102 05/10/2016 0403   CO2 23 05/10/2016 0403   GLUCOSE 153 (H) 05/10/2016 0403   BUN 14 05/10/2016 0403   CREATININE 1.16 05/10/2016 0403   CALCIUM 9.9 05/10/2016 0403   PROT 6.9 05/09/2016 0940   ALBUMIN 4.3 05/09/2016 0940   AST 44 (H) 05/09/2016 0940   ALT 55 05/09/2016 0940   ALKPHOS 64 05/09/2016 0940   BILITOT 0.9 05/09/2016 0940   GFRNONAA >60 05/10/2016 0403   GFRAA >60 05/10/2016 0403   Lab Results  Component Value Date   CHOL 182 05/10/2016   HDL 38 (L) 05/10/2016   LDLCALC 111 (H) 05/10/2016   TRIG 163 (H) 05/10/2016   CHOLHDL 4.8 05/10/2016   Lab Results  Component Value Date   HGBA1C 5.1 05/10/2016   No results found for: [id number] Lab Results  Component Value Date   TSH 0.416 05/10/2016       ASSESSMENT AND PLAN 28 y.o. year old male  has a past medical history of Concussion (04/2009), Migraine, Passed out (04/17/2016), and Seizures ([place]). here with ***  No diagnosis found.     No orders of the defined types were placed in this encounter.    No orders of the defined types were placed in this encounter.     I spent 15 minutes with the patient. 50% of this time was spent counseling and educating patient on plan of care and medications.    Debbora Presto, FNP-C 04/10/2019, 12:38 PM [facility] [address] [place], [place] [address] [phone]

## 2019-04-11 ENCOUNTER — Telehealth: Payer: Self-pay

## 2019-04-11 NOTE — Telephone Encounter (Signed)
Patient was a no call/no show for their appointment on 04/10/2019.
